# Patient Record
Sex: Female | Born: 1970 | Hispanic: Yes | Marital: Married | Smoking: Never smoker
Health system: Southern US, Community
[De-identification: ages and names within clinical notes are randomized; demographics above are authoritative.]

## PROBLEM LIST (undated history)

## (undated) ENCOUNTER — Ambulatory Visit: Admission: EM

## (undated) DIAGNOSIS — N950 Postmenopausal bleeding: Secondary | ICD-10-CM

## (undated) DIAGNOSIS — E7800 Pure hypercholesterolemia, unspecified: Secondary | ICD-10-CM

## (undated) DIAGNOSIS — F329 Major depressive disorder, single episode, unspecified: Secondary | ICD-10-CM

## (undated) DIAGNOSIS — N870 Mild cervical dysplasia: Secondary | ICD-10-CM

## (undated) DIAGNOSIS — R7303 Prediabetes: Secondary | ICD-10-CM

## (undated) DIAGNOSIS — F32A Depression, unspecified: Secondary | ICD-10-CM

## (undated) DIAGNOSIS — E669 Obesity, unspecified: Secondary | ICD-10-CM

## (undated) HISTORY — PX: TUBAL LIGATION: SHX77

## (undated) HISTORY — DX: Obesity, unspecified: E66.9

## (undated) HISTORY — DX: Prediabetes: R73.03

## (undated) HISTORY — DX: Postmenopausal bleeding: N95.0

## (undated) HISTORY — DX: Pure hypercholesterolemia, unspecified: E78.00

## (undated) HISTORY — DX: Mild cervical dysplasia: N87.0

---

## 2008-11-28 ENCOUNTER — Emergency Department (HOSPITAL_COMMUNITY): Admission: EM | Admit: 2008-11-28 | Discharge: 2008-11-28 | Payer: Self-pay | Admitting: Emergency Medicine

## 2009-03-03 ENCOUNTER — Emergency Department (HOSPITAL_COMMUNITY): Admission: EM | Admit: 2009-03-03 | Discharge: 2009-03-04 | Payer: Self-pay | Admitting: Emergency Medicine

## 2009-04-05 ENCOUNTER — Ambulatory Visit: Payer: Self-pay | Admitting: Obstetrics & Gynecology

## 2009-06-24 ENCOUNTER — Emergency Department (HOSPITAL_COMMUNITY): Admission: EM | Admit: 2009-06-24 | Discharge: 2009-06-24 | Payer: Self-pay | Admitting: Emergency Medicine

## 2009-07-11 ENCOUNTER — Ambulatory Visit: Payer: Self-pay | Admitting: Obstetrics and Gynecology

## 2009-08-23 ENCOUNTER — Ambulatory Visit: Payer: Self-pay | Admitting: Obstetrics and Gynecology

## 2009-08-24 ENCOUNTER — Encounter (INDEPENDENT_AMBULATORY_CARE_PROVIDER_SITE_OTHER): Payer: Self-pay | Admitting: *Deleted

## 2009-08-24 LAB — CONVERTED CEMR LAB
GC Probe Amp, Genital: NEGATIVE
Yeast Wet Prep HPF POC: NONE SEEN

## 2010-04-13 LAB — POCT URINALYSIS DIP (DEVICE)
Bilirubin Urine: NEGATIVE
Nitrite: NEGATIVE
Specific Gravity, Urine: >=1.03 (ref 1.005–1.030)
Urobilinogen, UA: 1 mg/dL (ref 0.0–1.0)
pH: 5 (ref 5.0–8.0)

## 2010-04-15 LAB — RAPID STREP SCREEN (MED CTR MEBANE ONLY): Streptococcus, Group A Screen (Direct): POSITIVE — AB

## 2010-04-18 LAB — CBC
HCT: 36.9 % (ref 36.0–46.0)
MCV: 92.5 fL (ref 78.0–100.0)
RDW: 12.9 % (ref 11.5–15.5)
WBC: 7.6 10*3/uL (ref 4.0–10.5)

## 2010-04-18 LAB — DIFFERENTIAL
Basophils Absolute: 0 10*3/uL (ref 0.0–0.1)
Basophils Relative: 0 % (ref 0–1)
Eosinophils Absolute: 0.2 10*3/uL (ref 0.0–0.7)
Monocytes Relative: 6 % (ref 3–12)

## 2010-04-18 LAB — COMPREHENSIVE METABOLIC PANEL
ALT: 18 U/L (ref 0–35)
Alkaline Phosphatase: 63 U/L (ref 39–117)
Calcium: 8.9 mg/dL (ref 8.4–10.5)
Chloride: 105 mEq/L (ref 96–112)
Creatinine, Ser: 0.43 mg/dL (ref 0.4–1.2)
GFR calc Af Amer: >60 mL/min (ref >60)
Potassium: 3.8 mEq/L (ref 3.5–5.1)
Sodium: 138 mEq/L (ref 135–145)

## 2010-04-18 LAB — URINALYSIS, ROUTINE W REFLEX MICROSCOPIC
Glucose, UA: NEGATIVE mg/dL
Ketones, ur: NEGATIVE mg/dL
Nitrite: NEGATIVE
pH: 6 (ref 5.0–8.0)

## 2010-04-18 LAB — URINE CULTURE
Colony Count: NO GROWTH
Culture: NO GROWTH

## 2010-04-18 LAB — APTT: aPTT: 29 seconds (ref 24–37)

## 2010-04-18 LAB — WET PREP, GENITAL
Clue Cells Wet Prep HPF POC: NONE SEEN
Trich, Wet Prep: NONE SEEN
Yeast Wet Prep HPF POC: NONE SEEN

## 2010-04-18 LAB — GC/CHLAMYDIA PROBE AMP, GENITAL
Chlamydia, DNA Probe: NEGATIVE
GC Probe Amp, Genital: NEGATIVE

## 2010-04-18 LAB — PROTIME-INR
INR: 1.1 (ref 0.00–1.49)
Prothrombin Time: 14.1 seconds (ref 11.6–15.2)

## 2010-04-18 LAB — ABO/RH: ABO/RH(D): O POS

## 2010-04-18 LAB — POCT PREGNANCY, URINE: Preg Test, Ur: NEGATIVE

## 2010-04-22 LAB — POCT PREGNANCY, URINE: Preg Test, Ur: NEGATIVE

## 2010-05-01 LAB — GC/CHLAMYDIA PROBE AMP, GENITAL: Chlamydia, DNA Probe: NEGATIVE

## 2010-06-05 ENCOUNTER — Inpatient Hospital Stay (HOSPITAL_COMMUNITY)
Admission: AD | Admit: 2010-06-05 | Discharge: 2010-06-05 | Disposition: A | Discharge status: Home / Self Care | Payer: Self-pay | Source: Ambulatory Visit | Attending: Obstetrics and Gynecology | Admitting: Obstetrics and Gynecology

## 2010-06-05 DIAGNOSIS — K5900 Constipation, unspecified: Secondary | ICD-10-CM

## 2010-06-05 DIAGNOSIS — M549 Dorsalgia, unspecified: Secondary | ICD-10-CM

## 2010-06-05 LAB — URINALYSIS, ROUTINE W REFLEX MICROSCOPIC
Bilirubin Urine: NEGATIVE
Glucose, UA: NEGATIVE mg/dL
Ketones, ur: NEGATIVE mg/dL
Nitrite: NEGATIVE
pH: 6.5 (ref 5.0–8.0)

## 2010-06-05 LAB — WET PREP, GENITAL
Clue Cells Wet Prep HPF POC: NONE SEEN
Trich, Wet Prep: NONE SEEN
Yeast Wet Prep HPF POC: NONE SEEN

## 2010-06-05 LAB — URINE MICROSCOPIC-ADD ON

## 2010-09-26 ENCOUNTER — Encounter: Payer: Self-pay | Admitting: *Deleted

## 2010-09-26 ENCOUNTER — Ambulatory Visit (INDEPENDENT_AMBULATORY_CARE_PROVIDER_SITE_OTHER): Payer: Self-pay | Admitting: Obstetrics and Gynecology

## 2010-09-26 DIAGNOSIS — N949 Unspecified condition associated with female genital organs and menstrual cycle: Secondary | ICD-10-CM

## 2010-09-26 DIAGNOSIS — Z124 Encounter for screening for malignant neoplasm of cervix: Secondary | ICD-10-CM

## 2010-09-26 DIAGNOSIS — R102 Pelvic and perineal pain: Secondary | ICD-10-CM

## 2010-09-26 NOTE — Progress Notes (Signed)
Used Language Line [phone]

## 2010-09-26 NOTE — Progress Notes (Signed)
This patient is a 40 year old Spanish-speaking Hispanic female gravida 4 para 76 whose youngest child is 50 years old and at which time she had a postpartum tubal ligation. For the past year she has developed severe low abdominal pain which starts midway between the umbilicus and the symphysis pubis in spreads laterally into her kidney area and down into her thighs. This pain starts 2 weeks before her period and she gets complete relief at the onset of her period. She never had sex or and this time because she is afraid of increasing the pain she takes ibuprofen for the pain occasionally but doesn't like to continue on medication. The pain is so bad when she gets up but she doesn't want to be around her children.  Examination: Breasts: Symmetrical no dominant masses no nipple discharge, no supraclavicular nor axillary nodes palpable. Abdomen: Soft flat nontender no masses no organomegaly. Genitalia: External genitalia within normal limits BUS within normal limits vagina: Clean and well rugated with no significant discharge. Cervix clean and parous well epithelialized. Bimanual pelvic examination: Uterus anterior normal size shape consistency adnexa normal. Pap smear was taken.  Plan is to get a pelvic ultrasound have the patient return in 2 weeks for results  Assessment: My feeling is that this is not simply mittelschmerz. Which can start out very abruptly but usually wanes over the next few days. 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  Impression: Normal pelvic examination with postovulatory pelvic pain lasting 2 weeks end of dictation

## 2010-10-02 ENCOUNTER — Ambulatory Visit (HOSPITAL_COMMUNITY)
Admission: RE | Admit: 2010-10-02 | Discharge: 2010-10-02 | Disposition: A | Discharge status: Home / Self Care | Payer: Self-pay | Source: Ambulatory Visit | Attending: Obstetrics and Gynecology | Admitting: Obstetrics and Gynecology

## 2010-10-02 DIAGNOSIS — N949 Unspecified condition associated with female genital organs and menstrual cycle: Secondary | ICD-10-CM | POA: Insufficient documentation

## 2010-10-02 DIAGNOSIS — R102 Pelvic and perineal pain: Secondary | ICD-10-CM

## 2010-10-02 DIAGNOSIS — D251 Intramural leiomyoma of uterus: Secondary | ICD-10-CM | POA: Insufficient documentation

## 2011-01-07 ENCOUNTER — Encounter (HOSPITAL_COMMUNITY): Payer: Self-pay | Admitting: *Deleted

## 2011-01-07 ENCOUNTER — Inpatient Hospital Stay (HOSPITAL_COMMUNITY)
Admission: AD | Admit: 2011-01-07 | Discharge: 2011-01-07 | Disposition: A | Discharge status: Home / Self Care | Payer: Self-pay | Source: Ambulatory Visit | Attending: Obstetrics & Gynecology | Admitting: Obstetrics & Gynecology

## 2011-01-07 DIAGNOSIS — N63 Unspecified lump in unspecified breast: Secondary | ICD-10-CM | POA: Insufficient documentation

## 2011-01-07 DIAGNOSIS — N6313 Unspecified lump in the right breast, lower outer quadrant: Secondary | ICD-10-CM

## 2011-01-07 NOTE — Progress Notes (Signed)
[facility] Department of Clinical Social Work Documentation of Interpretation   I assisted ___Jolynn RN________________ with interpretation of __questions____________________ for this patient. 

## 2011-01-07 NOTE — ED Provider Notes (Signed)
History   Pt presents today c/o 2 "lumps" in her right breast that she recently noticed. She denies nipple dc, recent trauma, fever, or any other sx at this time.  Chief Complaint  Patient presents with  . Breast Mass   HPI  OB History    Grav Para Term Preterm Abortions TAB SAB Ect Mult Living   4 4 4      1 5       Past Medical History  Diagnosis Date  . No pertinent past medical history     Past Surgical History  Procedure Date  . Tubal ligation     7 years ago    Family History  Problem Relation Age of Onset  . Hypertension Mother   . Diabetes Mother   . Heart disease Father   . Anesthesia problems Neg Hx     History  Substance Use Topics  . Smoking status: Never Smoker   . Smokeless tobacco: Never Used  . Alcohol Use: No    Allergies: No Known Allergies  No prescriptions prior to admission    Review of Systems  Constitutional: Negative for fever.  Respiratory: Negative for cough.   Cardiovascular: Negative for chest pain and palpitations.  Gastrointestinal: Negative for nausea, vomiting, abdominal pain, diarrhea and constipation.  Genitourinary: Negative for dysuria, urgency, frequency and hematuria.  Neurological: Negative for dizziness and headaches.  Psychiatric/Behavioral: Negative for depression and suicidal ideas.   Physical Exam   Blood pressure 115/72, pulse 74, temperature 98.1 F (36.7 C), temperature source Oral, resp. rate 20, height 5\' 3"  (1.6 m), weight 169 lb (76.658 kg), last menstrual period 12/27/2010.  Physical Exam  Nursing note and vitals reviewed. Constitutional: She is oriented to person, place, and time. She appears well-developed and well-nourished. No distress.  HENT:  Head: Normocephalic and atraumatic.  Respiratory:       Pt with 2 1cm nodules at 6 and 8 o'clock on the right breast. Both nodules are firm and mobile. No nipple dc. No erythema, edema, or drainage noted.  GI: Soft. She exhibits no distension. There is no  tenderness. There is no rebound and no guarding.  Neurological: She is alert and oriented to person, place, and time.  Skin: Skin is warm and dry. She is not diaphoretic.  Psychiatric: She has a normal mood and affect. Her behavior is normal. Judgment and thought content normal.    MAU Course  Procedures    Assessment and Plan  Breast Lump: discussed with pt at length. Appt made [facility] pt at [facility] on 01/22/11 at 1:00pm. Discussed with pt at length.   Clinton Gallant. Kaya Pottenger III, DrHSc, MPAS, PA-C  01/07/2011, 1:43 PM   Henrietta Hoover, PA 01/07/11 1356

## 2011-01-07 NOTE — Progress Notes (Signed)
2 lumps in rt breast (about 4 o'clock) noted 2 days ago. Little pain., slight tender to touch.

## 2011-01-22 ENCOUNTER — Ambulatory Visit
Admission: RE | Admit: 2011-01-22 | Discharge: 2011-01-22 | Disposition: A | Discharge status: Home / Self Care | Payer: Self-pay | Source: Ambulatory Visit | Attending: Obstetrics & Gynecology | Admitting: Obstetrics & Gynecology

## 2011-01-22 ENCOUNTER — Ambulatory Visit: Admit: 2011-01-22 | Payer: Self-pay

## 2011-01-22 ENCOUNTER — Other Ambulatory Visit (HOSPITAL_COMMUNITY): Payer: Self-pay | Admitting: Obstetrics & Gynecology

## 2011-01-22 ENCOUNTER — Ambulatory Visit
Admit: 2011-01-22 | Discharge: 2011-01-22 | Disposition: A | Discharge status: Home / Self Care | Payer: Self-pay | Attending: Obstetrics & Gynecology | Admitting: Obstetrics & Gynecology

## 2011-01-22 DIAGNOSIS — N6313 Unspecified lump in the right breast, lower outer quadrant: Secondary | ICD-10-CM

## 2011-09-15 ENCOUNTER — Emergency Department (HOSPITAL_COMMUNITY)
Admission: EM | Admit: 2011-09-15 | Discharge: 2011-09-15 | Disposition: A | Discharge status: Home / Self Care | Payer: Self-pay | Attending: Emergency Medicine | Admitting: Emergency Medicine

## 2011-09-15 ENCOUNTER — Encounter (HOSPITAL_COMMUNITY): Payer: Self-pay | Admitting: Emergency Medicine

## 2011-09-15 DIAGNOSIS — Z833 Family history of diabetes mellitus: Secondary | ICD-10-CM | POA: Insufficient documentation

## 2011-09-15 DIAGNOSIS — Z8489 Family history of other specified conditions: Secondary | ICD-10-CM | POA: Insufficient documentation

## 2011-09-15 DIAGNOSIS — M62838 Other muscle spasm: Secondary | ICD-10-CM | POA: Insufficient documentation

## 2011-09-15 DIAGNOSIS — Z8249 Family history of ischemic heart disease and other diseases of the circulatory system: Secondary | ICD-10-CM | POA: Insufficient documentation

## 2011-09-15 MED ORDER — IBUPROFEN 800 MG PO TABS: 800.0000 mg | ORAL_TABLET | Freq: Three times a day (TID) | ORAL | Status: AC | PRN

## 2011-09-15 NOTE — ED Notes (Signed)
Pt c/o pain and "knot on left breast" x 2 weeks; pt sts pain into back and arm

## 2011-09-15 NOTE — ED Provider Notes (Signed)
History   This chart was scribed for No att. providers found by Melba Coon. The patient was seen in room TR08C/TR08C and the patient's care was started at 1815.    CSN: [id number]  Arrival date & time 09/15/11  1725   None     Chief Complaint  Patient presents with  . Breast Pain    (Consider location/radiation/quality/duration/timing/severity/associated sxs/prior treatment) The history is provided by a relative. A language interpreter was used.  Pt asked to use family member, declined telephone interpreter.  Crystal [name] is a 41 y.o. female who presents to the Emergency Department complaining of constant, moderate left breast pain pertaining to a present mass/knot with an onset 2 weeks ago. States she has pain in L shoulder and arm, worse with movement. No nipple drainage, no fever.     No HA, fever, neck pain, sore throat, rash, SOB, abd pain, n/v/d, dysuria, or extremity edema, weakness, numbness, or tingling. No known allergies. No other pertinent medical symptoms.   Past Medical History  Diagnosis Date  . No pertinent past medical history     Past Surgical History  Procedure Date  . Tubal ligation     7 years ago    Family History  Problem Relation Age of Onset  . Hypertension Mother   . Diabetes Mother   . Heart disease Father   . Anesthesia problems Neg Hx     History  Substance Use Topics  . Smoking status: Never Smoker   . Smokeless tobacco: Never Used  . Alcohol Use: No    OB History    Grav Para Term Preterm Abortions TAB SAB Ect Mult Living   4 4 4      1 5       Review of Systems 10 Systems reviewed and all are negative for acute change except as noted in the HPI.   Allergies  Review of patient's allergies indicates no known allergies.  Home Medications   Current Outpatient Rx  Name Route Sig Dispense Refill  . IBUPROFEN 200 MG PO TABS Oral Take 200 mg by mouth every 6 (six) hours as needed. For cramping.       BP  126/74  Pulse 103  Temp 98.1 F (36.7 C) (Oral)  Resp 16  SpO2 99%  Physical Exam  Constitutional: She is oriented to person, place, and time. She appears well-developed and well-nourished.  HENT:  Head: Normocephalic and atraumatic.  Neck: Neck supple.  Pulmonary/Chest: Effort normal. Left breast exhibits no inverted nipple, no mass, no nipple discharge, no skin change and no tenderness. Breasts are symmetrical.       No axillary lymphadenopathy  Musculoskeletal:       Tender in L trapezius muscle  Neurological: She is alert and oriented to person, place, and time. No cranial nerve deficit.  Psychiatric: She has a normal mood and affect. Her behavior is normal.    ED Course  Procedures (including critical care time)  DIAGNOSTIC STUDIES: Oxygen Saturation is 99% on room air, normal by my interpretation.    COORDINATION OF CARE:     Labs Reviewed - No data to display No results found.   No diagnosis found.    MDM  Pt has paperwork from visit to [facility] in 12/2010 which advised 6 month followup. She has not been back for evaluation, does not have PCP. She does not have any discernible breast mass on my exam. Shoulder tenderness is muscle spasm. Advised NSAID, establish with PCP and followup  in [facility].   I personally performed the services described in the documentation, which were scribed in my presence. The recorded information has been reviewed and considered.         Tesla Bochicchio B. Bernette Mayers, MD 09/15/11 1610

## 2011-10-16 ENCOUNTER — Ambulatory Visit (INDEPENDENT_AMBULATORY_CARE_PROVIDER_SITE_OTHER): Payer: Self-pay | Admitting: Obstetrics & Gynecology

## 2011-10-16 ENCOUNTER — Encounter: Payer: Self-pay | Admitting: Obstetrics & Gynecology

## 2011-10-16 VITALS — BP 112/81 | HR 63 | Temp 97.9°F | Ht 62.0 in | Wt 169.2 lb

## 2011-10-16 DIAGNOSIS — Z01419 Encounter for gynecological examination (general) (routine) without abnormal findings: Secondary | ICD-10-CM

## 2011-10-16 NOTE — Patient Instructions (Signed)
Cuidados preventivos en las mujeres adultas  (Preventive Care for Adults, Female) Un estilo de vida saludable y los cuidados preventivos pueden favorecer la salud y el bienestar. Las guas para conservar la salud para las mujeres incluyen las siguientes prcticas clave:   Un examen fsico de rutina anual es un buen modo de controlar su salud y realizar estudios preventivos. Le da la posibilidad de compartir preocupaciones y conocer el estado de su salud, y que le realicen estudios completos.   Consulte al dentista para realizar un examen de rutina y cuidados preventivos cada 6 meses. Cepllese los dientes al menos dos veces por da y psese el hilo dental al menos una vez por da. Una buena higiene bucal evita caries y enfermedades de las encas.   La frecuencia con que debe hacerse exmenes de la vista depende de la edad, el estado de salud, la historia familiar, el uso de lentes de contacto y otros factores. Siga las recomendaciones del mdico para saber con qu frecuencia debe hacerse exmenes de la vista.   Consuma una dieta saludable. Los alimentos como vegetales, frutas, granos enteros, productos lcteos descremados y protenas magras contienen los nutrientes que usted necesita sin necesidad de consumir muchas caloras. Disminuya el consume de alimentos con alto contenido de grasas slidas, azcar y sal agregadas. Consuma la cantidad de caloras adecuada para usted.Si es necesario, pdale una dieta adecuada al profesional que lo asiste.   La actividad fsica regular es una de las cosas ms importantes que puede hacer por su salud. Los adultos deben hacer al menos 150 minutos de ejercicios de actividad de intensidad moderada (toda actividad que aumente la frecuencia cardaca y lo haga transpirar) cada semana. Adems, la mayora de los adultos necesita ejercicios de fortalecimiento muscular 2  ms das por semana.   Hay que mantener un peso saludable. El ndice de masa corporal (IMC) es una  herramienta que identifica posibles problemas con el peso. Proporciona una estimacin de la grasa corporal basndose en el peso y la altura. El mdico podr determinar si IMC y podr ayudarlo a lograr o mantener un peso saludable.Para los adultos de 20 aos o ms:   Un IMC menor a 18,5 se considera bajo peso.   Un IMC entre 18,5 y 24,9 es normal.   Un IMC entre 25 y 29,9 es sobrepeso.   Un IMC entre 30 o ms es obesidad.   Mantenga un nivel normal de lpidos y colesterol en sangre practicando actividad fsica y minimizando la ingesta de grasas saturadas. Consuma una dieta balanceada y saludable, e incluya variedad de frutas y vegetales. Los anlisis de lpidos y colesterol en sangre deben comenzar a los 20 aos y repetirse cada 5 aos. Si los niveles de colesterol son altos, tiene ms de 50 aos o tiene riesgo elevado de sufrir enfermedades cardacas necesitar controlarse con ms frecuencia.Si tiene niveles elevados de lpidos y colesterol, debe recibir tratamiento con medicamentos, si la dieta y el ejercicio no son efectivos.   Si fuma, consulte con el profesional acerca de las opciones para dejar de hacerlo. Si no lo hace, no comience.   Si est embarazada no beba alcohol. Si est amamantando, beba alcohol con prudencia. Si elige beber alcohol, no se exceda de 1 medida por da. Se considera una medida a 12 onzas (355 ml) de cerveza, 5 onzas (148 ml) de vino, o 1,5 onzas (44 ml) de licor.   Evite el alcohol y el consumo de drogas. No comparta agujas. Pida ayuda si necesita   asistencia o instrucciones con respecto a abandonar el consumo de alcohol, cigarrillos o drogas.   La hipertensin arterial causa enfermedades cardacas y aumenta el riesgo de ictus. Debe controlar su presin arterial al menos cada 1 o 2 aos. La presin arterial elevada que persiste debe tratarse con medicamentos si la prdida de peso y el ejercicio no son efectivos.   Si tiene entre 55 y 79 aos, consulte a su mdico si  debe tomar aspirina para prevenir enfermedades cardacas.   Los anlisis para la diabetes incluyen la toma de una muestra de sangre para controlar el nivel de azcar en la sangre durante el ayuno. Debe hacerlo cada 3 aos despus de los 45 aos si est dentro de su peso normal y sin factores de riesgo para la diabetes. Las pruebas deben comenzar a edades tempranas o llevarse a cabo con ms frecuencia si tiene sobrepeso y al menos 1 factor de riesgo para la diabetes.   Las evaluaciones para detectar el cncer de mama son un mtodo preventivo fundamental para las mujeres. Debe practicar la "autoconciencia de las mamas". Esto significa que debe comprender como es la apariencia normal y como se sienten sus mamas e incluir un autoexamen. Si detecta algn cambio, no importa cun pequeo sea, debe informarlo a su mdico. Las mujeres entre 20 y 30 aos deben hacer un examen clnico de las mamas como parte del examen regular de salud, cada 1 a 3 aos. Despus de los 40 aos deben hacerlo todos los aos. Deben hacerse una mamografa cada ao, comenzando a los 40 aos. Las mujeres con historia familiar de cncer de mama deben hablar con el mdico para hacer un estudio gentico. Las que tienen ms riesgo deben hacerse una ecografa y una mamografa todos los aos.   Un papanicolau se realiza para diagnosticar cncer de cuello de tero. Muestra los cambios celulares en el cuello que pueden transformarse en cncer si no se tratan. El papanicolau es un procedimiento por el que se obtienen clulas de la parte inferior del tero (cuello) y son examinadas.   Las mujeres deben hacerse un papanicolau a partir de los 21 aos.   Entre los 21 y los 29 aos debe repetirse cada dos aos.   Luego de los 30 aos, debe realizarse un Papanicolaou cada tres aos siempre que los 3 estudios anteriores sean normales.   Algunas mujeres sufren problemas mdicos que aumenta la probabilidad de contraer cncer cervical. Consulte a su  mdico acerca de estos problemas. Es muy importante que le informe a su mdico si aparecen nuevos problemas poco despus de su ltimo Papanicolaou. En estos casos, el mdico podr indicar que se realice el Papanicolaou con ms frecuencia.   Estas recomendaciones son las mismas para todas las mujeres hayan recibido o no la vacuna para el VPH (virus del papiloma humano).   Si le han realizado una histerectoma por un problema que no era cncer u otra enfermedad que podra causar cncer, ya no necesitar un Papanicolaou. Sin embargo, si ya no necesita hacerse un Papanicolau, es una buena idea hacerse un examen regularmente para asegurarse de que no hay otros problemas.   Si tiene entre 65 y 70 aos y ha tenido un Papanicolaou normal en los ltimos 10 aos, ya no ser necesario realizarlo. Sin embargo, si ya no necesita hacerse un Papanicolau, es una buena idea hacerse un examen regularmente para asegurarse de que no hay otros problemas.   Si ha recibido un tratamiento para el cncer cervical o para   una enfermedad que podra causar cncer, necesitar realizar un Papanicolaou y controles durante al menos 20 aos de concluir el tratamiento.   Si no se ha hecho el examen con regularidad, debern volver a evaluarse los factores de riesgo (como el tener un nuevo compaero sexual) para determinar si debe volver a realizarse los estudios.   La prueba del VPH es un anlisis adicional que puede usarse para detectar cncer de cuello de tero. Esta prueba busca la presencia del virus que causa los cambios en el cuello. Las clulas que se recolectan durante el papanicolau pueden usarse para el VPH. La prueba para el VPH puede usarse para evaluar a mujeres de ms de 30 aos y debe usarse en mujeres de cualquier edad cuyos resultados del papanicolau no sean claros. Despus de los 30 aos, las mujeres deben hacerse el anlisis para el VPH con la misma frecuencia que el papanicolau.   El cncer colorectal puede detectarse  y con fecuencia puede prevenirse. La mayor parte de los estudios de rutina comienzan a los 50 aos y continan hasta los 75 aos. Sin embargo, el mdico podr aconsejarle que lo haga antes, si tiene factores de riesgo para el cncer de colon. Una vez por ao, el profesional le dar un kit de prueba para hallar sangre oculta en la materia fecal. Utiliza un tubo con una pequea cmara en su extremo para examinar directamente el colon (sigmoidoscopa o colonoscopa), para detectar formas temprana de cncer colorectal. Hable con su mdico si tiene 50 aos, cuando comience con los estudios de rutina. El examen directo del colon debe repetirse cada 5 a 10 aos, hasta los 75 aos, excepto que se encuentren formas tempranas de plipos precancerosos o pequeos bultos.   Se recomienda realizar un anlisis de sangre para detectar hepatitis C a todas las personas nacidas entre 1945 y 1965, y a todo aquel que tenga un riesgo conocido de haber contrado esta enfermedad.   Practique el sexo seguro. Use condones y evite las prcticas sexuales riesgosas para disminuir el contagio de enfermedades de transmisin sexual. Las enfermedades de transmisin sexual son la gonorrea, clamidia, sfilis, tricomonas, herpes, VPH y el virus de inmunodeficiencia humana (VIH). El herpes, el VIH y el VPH son enfermedades virales que no tienen cura. Pueden producir discapacidad, cncer y hasta la muerte. Las mujeres sexualmente activas de 25 aos o menos deben ser evaluadas para detectar clamidia. Las mujeres mayores que tengan mltiples compaeros tambin deben hacerse el anlisis para detectar clamidia. Se recomienda realizar anlisis para detectar otras enfermedades de transmisin sexual si es sexualmente activa y tiene riesgos.   La osteoporosis es una enfermedad en la que los huesos pierden los minerales y la fuerza por el avance de la edad. El resultado pueden ser fracturas graves en los huesos. El riesgo de osteoporosis puede  identificarse con una prueba de densidad sea. Las mujeres de ms de 65 aos y las que tengan riesgos de sufrir fracturas u osteoporosis deben pedir consejo a su mdico. Consulte a su mdico si debe tomar un suplemento de calcio o de vitamina D para reducir el riesgo de osteoporosis.   La menopausia se asocia a sntomas y riesgos fsicos. Se dispone de una terapia de reemplazo hormonal para disminuir los sntomas y los riesgos. Consulte a su mdico para saber si la terapia de reemplazo hormonal es conveniente para usted.   Use una pantalla solar con un factor SPF de 30 o mayor. Aplique pantalla de manera libre y repetida a lo largo   del da. Pngase al resguardo del sol cuando la sombra sea ms pequea que usted. Protjase usando mangas y pantalones largos, un sombrero de ala ancha y gafas para el sol todo el ao, siempre que se encuentre en el exterior.   Una vez por mes hgase un examen de la piel de todo el cuerpo usando un espejo para ver la espalda. nforme al mdico si aparecen nuevos lunares, los que ya estn tienen bordes irregulares, los que sean ms grandes que una goma de lpiz o los que hayan cambiado su forma o color.   Mantngase al da con las vacunas.   Gripe: Debe aplicarse una dosis todos en cada otoo (o invierno). La composicin de la vacuna de la gripe cambia todos los aos, por lo tanto no es suficiente con vacunarse una vez.   Vacuna antineumocccica de polisacridos Debe aplicarse 1  2 dosis si fuma o si sufre ciertas enfermedades crnicas. Necesitar 1 dosis a los 65 aos (o ms) si nunca se ha vacunado.   Vacuna difteria, ttanos, tos convulsa (DTP). Aplquese una dosis de la vacuna DTaP (vacuna contra la tos convulsa para adultos) si es menor de 65 aos, si tiene ms de 65 aos y est en contacto con un beb, es un trabajador de la salud, es una mujer embarazada o simplemente quiere estar protegido de la enfermedad. Luego necesitar un refuerzo de DT cada 10 aos. Consulte  con su mdico si no ha recibido al menos 3 dosis de la vacuna contra el ttanos (y la difteria) en algn momento de su vida o tiene una herida profunda o sucia.   VPH: Debe aplicarse esta vacuna si tiene 26 aos o menos. La vacuna se administra en 3 dosis, generalmente durante el curso de 6 meses.   MMR (sarampin, paperas, rubola) Debe aplicarse al menos una dosis de MMR si ha nacido en 1957 o despus. Podra tambin necesitar una segunda dosis.   Antimeningocccica Si tiene entre 19 y 21 aos y es un estudiante universitario de primer ao que vive en una residencia estudiantil, o tiene alguna enfermedad mdica, debe recibir esta vacuna. Podra tambin necesitar dosis de refuerzo.   Herpes zoster (culebrilla). Si tiene 60 aos o ms debe aplicarse esta vacuna ahora.   Varicela Si nunca se vacun o slo recibi una dosis, hable con su mdico para averiguar si necesita aplicarse esta vacuna.   Hepatitis A. Debe aplicarse esta vacuna si tiene un factor de riesgo especfico para contraer una infeccin por el virus de la hepatitis A, o simplemente desea estar protegido contra la enfermedad. La vacuna se administra en 2 dosis, con una diferencia entre 6 y 18 meses.   Hepatitis B. Debe aplicarse esta vacuna si tiene un factor de riesgo especfico para contraer una infeccin por el virus de la hepatitis B, o simplemente desea estar protegido contra la enfermedad. La vacuna se administra en 3 dosis, generalmente durante el curso de 6 meses.  Controles preventivos - Frecuencia Edad 19 a 39  Control de la presin arterial.** / Cada 1 a 2 aos.   Control de lpidos y colesterol.** / Cada 5 aos, comenzando a los 20 aos.   Examen clnico de mamas.** / Cada 3 aos en las mujeres entre los 20 y los 30 aos.   Papanicolau.** / Cada 2 aos entre los 21 y los 29 aos. Despus de los 30 aos, y hasta los 65 o 70, con una historia de 3 papanicolau normales consecutivos.   Pruebas para   el VPH.** / Cada 3  aos, a partir de los 30 aos, y hasta los 65 o 70, con una historia de 3 papanicolau normales consecutivos.   Anlisis de sangre para la hepatitis C. ** / Para todo individuo con riesgos conocidos para la hepatitis C.   Autoexamen de piel. / Todos los meses.   Vacuna contra la gripe.** / Todos los aos.   Vacuna antineumocccica de polisacridos.** / Debe aplicarse 1  2 dosis si fuma o si sufre ciertas enfermedades crnicas.   Vacuna difteria, ttanos, tos convulsa (Tdap, Td). / Una dosis nica de vacuna Tdap. Luego necesitar un refuerzo de DT cada 10 aos.   Vacuna contra el VPH. / 3 dosis en el curso de 6 meses, si tiene 26 aos o menos.   MMR (sarampin, paperas, rubola). / Debe aplicarse al menos una dosis de MMR si ha nacido en 1957 o despus. Podra tambin necesitar una segunda dosis.   Vacunacin antimeningocccica. / Si tiene entre 19 y 21 aos y es un estudiante universitario de primer ao que vive en una residencia estudiantil, o tiene alguna enfermedad mdica, debe recibir esta vacuna. Podra tambin necesitar dosis de refuerzo.   Vacuna contra la varicela.** / Consltelo con el mdico.   Vacuna contra la hepatitis A.** / Consltelo con el mdico. 2 dosis, con un intervalo entre 6 a 18 meses.   Vacuna contra la hepatitis B.** / Consltelo con el mdico. 3 dosis en el curso de 6 meses.  Edad 40 a 64  Control de la presin arterial.** / Cada 1 a 2 aos.   Control de lpidos y colesterol. **/ Cada 5 aos, comenzando a los 20 aos.   Examen clnico de mamas.** / Todos los aos despus de los 40 aos.   Mamografa.** / Una vez por ao a partir de los 40 aos y continuando siempre que tenga buena salud. Consulte con el mdico.   Papanicolau.** / Cada 3 aos despus de los 30 aos, y hasta los 65 o 70, con una historia de 3 papanicolau normales consecutivos.   Pruebas para el VPH.** / Cada 3 aos despus de los 30 aos, y hasta los 65 o 70, con una historia de 3  papanicolau normales consecutivos.   Prueba de sangre oculta en materia fecal. / Cada ao comenzando a los 50 aos continuando hasta los 75. No tendr que hacerlo si se ha hecho una colonoscopa cada 10 aos.   Sigmoidoscopa flexible** o colonoscopa.** / Cada 5 aos para la sigmoidoscopa flexible o cada 10 aos para la colonoscopa, comenzando a los 50 aos y continuando hasta los 75 aos.   Anlisis de sangre para la hepatitis C. ** / Para todas las personas nacidas entre 1945 y 1965 y a todo aquel que tenga un riesgo conocido para la hepatitis C.   Autoexamen de piel. / Todos los meses.   Vacuna contra la gripe.** / Todos los aos.   Vacuna antineumocccica de polisacridos.** / Debe aplicarse 1  2 dosis si fuma o si sufre ciertas enfermedades crnicas.   Vacuna difteria, ttanos, tos convulsa (Tdap, Td). / Una dosis nica de vacuna Tdap. Luego necesitar un refuerzo de DT cada 10 aos.   MMR (sarampin, paperas, rubola). / Debe aplicarse al menos una dosis de MMR si ha nacido en 1957 o despus. Podra tambin necesitar una segunda dosis.   Vacuna contra la varicela.**/ Consltelo con el mdico.   Vacunacin antimeningocccica. / Consltelo con el mdico.   Vacuna contra   la hepatitis A.**/ Consltelo con el mdico. 2 dosis, con un intervalo entre 6 a 18 meses.   Vacuna contra la hepatitis B.** / Consltelo con el mdico. 3 dosis en el curso de 6 meses.  Edad 65 o ms  Control de la presin arterial.** / Cada 1 a 2 aos.   Control de lpidos y colesterol. **/ Cada 5 aos, comenzando a los 20 aos.   Examen clnico de mamas.** / Todos los aos despus de los 40 aos.   Mamografa.** / Una vez por ao a partir de los 40 aos y continuando siempre que tenga buena salud. Consulte con el mdico.   Papanicolau. ** / Cada 3 aos despus de los 30 aos, y hasta los 65 o 70, con una historia de 3 papanicolau normales consecutivos. Las pruebas pueden detenerse entre los 65 y los 70  aos, si tiene 3 papanicolau consecutivos normales y no tuvo un papanicoalu ni prueba de VPH anormales en los ltimos 10 aos.   Pruebas para el VPH.** / Cada 3 aos despus de los 30 aos, y hasta los 65 o 70, con una historia de 3 papanicolau normales consecutivos. Las pruebas pueden detenerse entre los 65 y los 70 aos, si tiene 3 papanicolau consecutivos normales y no tuvo un papanicoalu ni prueba de VPH anormales en los ltimos 10 aos.   Prueba de sangre oculta en materia fecal. / Cada ao comenzando a los 50 aos continuando hasta los 75. No tendr que hacerlo si se ha hecho una colonoscopa cada 10 aos.   Sigmoidoscopa flexible** o colonoscopa.** / Cada 5 aos para la sigmoidoscopa flexible o cada 10 aos para la colonoscopa, comenzando a los 50 aos y continuando hasta los 75 aos.   Anlisis de sangre para la hepatitis C. ** / Para todas las personas nacidas entre 1945 y 1965 y a todo aquel que tenga un riesgo conocido para la hepatitis C.   Pruebas para la osteoporosis.** / Por nica vez en mujeres de ms de 65 aos que tengan riesgo de fracturas u osteoporosis.   Autoexamen de piel. / Todos los meses.   Vacuna contra la gripe.** / Todos los aos.   Vacuna antineumocccica de polisacridos.** / Necesitar 1 dosis a los 65 aos (o ms) si nunca se ha vacunado.   Vacuna difteria, ttanos, tos convulsa (Tdap, Td). / Aplquese una dosis de la vacuna DTaP (vacuna contra la tos convulsa para adultos) si tiene ms de 65 aos y est en contacto con un beb, es un trabajador de la salud, es una mujer embarazada o simplemente quiere estar protegido de la enfermedad. Luego necesitar un refuerzo de DT cada 10 aos.   Vacuna contra la varicela.**/ Consltelo con el mdico.   Vacunacin antimeningocccica.** / Consltelo con el mdico.   Vacuna contra la hepatitis A.** / Consltelo con el mdico. 2 dosis, con un intervalo entre 6 a 18 meses.   Vacuna contra la hepatitis B.** / Consulte  con el mdico. 3 dosis en el curso de 6 meses.  **La historia familiar y personal de riesgos y enfermedades puede cambiar las recomendaciones del mdico. Document Released: 10/23/2004 Document Revised: 01/02/2011 [employer] Patient Information 2012 [employer]. 

## 2011-10-16 NOTE — Progress Notes (Signed)
  Subjective:     Crystal [name] is a 76 [id number] female and is here for a comprehensive gynecologic physical exam. The patient reports a small amount of vaginal discharge with odor, notices this after her menstrual period. No pruritus.  History   Social History  . Marital Status: Single    Spouse Name: N/A    Number of Children: N/A  . Years of Education: N/A   Occupational History  . Not on file.   Social History Main Topics  . Smoking status: Never Smoker   . Smokeless tobacco: Never Used  . Alcohol Use: No  . Drug Use: No  . Sexually Active: Yes    Birth Control/ Protection: Surgical   Other Topics Concern  . Not on file   Social History Narrative  . No narrative on file   Health Maintenance  Topic Date Due  . Tetanus/tdap  02/02/1990  . Influenza Vaccine  09/28/2011  . Pap Smear  09/25/2013   The following portions of the patient's history were reviewed and updated as appropriate: allergies, current medications, past family history, past medical history, past social history, past surgical history and problem list.  Review of Systems A comprehensive review of systems was negative.   Objective:   Blood pressure 112/81, pulse 63, temperature 97.9 F (36.6 C), temperature source Oral, height 5\' 2"  (1.575 m), weight 169 lb 3.2 oz (76.749 kg), last menstrual period 09/22/2011. GENERAL: Well-developed, well-nourished female in no acute distress.  HEENT: Normocephalic, atraumatic. Sclerae anicteric.  NECK: Supple. Normal thyroid.  LUNGS: Clear to auscultation bilaterally.  HEART: Regular rate and rhythm. BREASTS: Symmetric with everted nipples. No masses, skin changes, nipple drainage, or lymphadenopathy. ABDOMEN: Soft, nontender, nondistended. No organomegaly. PELVIC: Normal external female genitalia. Vagina is pink and rugated.  Normal discharge. Normal cervix contour. Pap smear obtained. Uterus is normal in size. No adnexal mass or tenderness.  EXTREMITIES:  No cyanosis, clubbing, or edema, 2+ distal pulses.   Assessment:    Healthy female exam.     Plan:    Pap done, follow up results and manage accordingly. Mammogram scholarship application given to patient Routine preventative health maintenance measures emphasized

## 2012-01-25 ENCOUNTER — Emergency Department (HOSPITAL_COMMUNITY)
Admission: EM | Admit: 2012-01-25 | Discharge: 2012-01-25 | Disposition: A | Discharge status: Home / Self Care | Payer: Self-pay | Attending: Emergency Medicine | Admitting: Emergency Medicine

## 2012-01-25 ENCOUNTER — Encounter (HOSPITAL_COMMUNITY): Payer: Self-pay | Admitting: Family Medicine

## 2012-01-25 DIAGNOSIS — M549 Dorsalgia, unspecified: Secondary | ICD-10-CM

## 2012-01-25 DIAGNOSIS — R109 Unspecified abdominal pain: Secondary | ICD-10-CM | POA: Insufficient documentation

## 2012-01-25 DIAGNOSIS — N390 Urinary tract infection, site not specified: Secondary | ICD-10-CM | POA: Insufficient documentation

## 2012-01-25 LAB — URINALYSIS, ROUTINE W REFLEX MICROSCOPIC
Bilirubin Urine: NEGATIVE
Glucose, UA: NEGATIVE mg/dL
Hgb urine dipstick: NEGATIVE
Ketones, ur: NEGATIVE mg/dL
Protein, ur: NEGATIVE mg/dL

## 2012-01-25 MED ORDER — SULFAMETHOXAZOLE-TRIMETHOPRIM 800-160 MG PO TABS: 1.0000 | ORAL_TABLET | Freq: Two times a day (BID) | ORAL | Status: AC

## 2012-01-25 MED ORDER — OXYCODONE-ACETAMINOPHEN 5-325 MG PO TABS: 1.0000 | ORAL_TABLET | Freq: Four times a day (QID) | ORAL | Status: DC | PRN

## 2012-01-25 NOTE — ED Provider Notes (Signed)
History    This chart was scribed for [employer]. Rubin Payor, MD, MD by Smitty Pluck, ED Scribe. The patient was seen in room TR09C and the patient's care was started at 3:49PM.   CSN: [id number]  Arrival date & time 01/25/12  1426      Chief Complaint  Patient presents with  . Back Pain    (Consider location/radiation/quality/duration/timing/severity/associated sxs/prior treatment) The history is provided by the patient. No language interpreter was used.   Yesly Gerety is a 41 y.o. female who presents to the Emergency Department complaining of constant, lower back pain onset 1 week ago. Pt reports that pain is worse at night. Walking aggravates the pain. She reports having moderate lower abdominal pain. Pt denies dysuria, vaginal bleeding, vaginal discharge, urinary incontinence, recent injury, fall, bowel incontinence and any other pain.   Past Medical History  Diagnosis Date  . No pertinent past medical history     Past Surgical History  Procedure Date  . Tubal ligation     7 years ago    Family History  Problem Relation Age of Onset  . Hypertension Mother   . Diabetes Mother   . Heart disease Father   . Anesthesia problems Neg Hx     History  Substance Use Topics  . Smoking status: Never Smoker   . Smokeless tobacco: Never Used  . Alcohol Use: No    OB History    Grav Para Term Preterm Abortions TAB SAB Ect Mult Living   4 4 4      1 5       Review of Systems  Constitutional: Negative for chills.  Respiratory: Negative for shortness of breath.   Gastrointestinal: Positive for abdominal pain. Negative for nausea and vomiting.  Genitourinary: Negative for dysuria, vaginal bleeding and vaginal discharge.  Musculoskeletal: Positive for back pain.  All other systems reviewed and are negative.    Allergies  Review of patient's allergies indicates no known allergies.  Home Medications   Current Outpatient Rx  Name  Route  Sig  Dispense  Refill    . IBUPROFEN 200 MG PO TABS   Oral   Take 400 mg by mouth every 6 (six) hours as needed. For cramping.         . OXYCODONE-ACETAMINOPHEN 5-325 MG PO TABS   Oral   Take 1-2 tablets by mouth every 6 (six) hours as needed for pain.   10 tablet   0   . SULFAMETHOXAZOLE-TRIMETHOPRIM 800-160 MG PO TABS   Oral   Take 1 tablet by mouth 2 (two) times daily.   14 tablet   0     BP 117/73  Pulse 66  Temp 97.9 F (36.6 C) (Oral)  Resp 18  SpO2 100%  LMP 01/11/2012  Physical Exam  Nursing note and vitals reviewed. Constitutional: She is oriented to person, place, and time. She appears well-developed and well-nourished. No distress.  HENT:  Head: Normocephalic and atraumatic.  Eyes: EOM are normal.  Neck: Neck supple. No tracheal deviation present.  Cardiovascular: Normal rate.   Pulmonary/Chest: Effort normal. No respiratory distress.  Abdominal: There is tenderness in the suprapubic area.  Musculoskeletal: Normal range of motion.       Mild pain in right lower back with raising of right leg Lumbar tenderness neurovascularly intact distally   Neurological: She is alert and oriented to person, place, and time.  Skin: Skin is warm and dry.  Psychiatric: She has a normal mood and affect. Her behavior  is normal.    ED Course  Procedures (including critical care time)   COORDINATION OF CARE: 3:53 PM Discussed ED treatment with pt     Labs Reviewed  URINALYSIS, ROUTINE W REFLEX MICROSCOPIC - Abnormal; Notable for the following:    APPearance CLOUDY (*)     Leukocytes, UA LARGE (*)     All other components within normal limits  URINE MICROSCOPIC-ADD ON - Abnormal; Notable for the following:    Squamous Epithelial / LPF FEW (*)     Bacteria, UA MANY (*)     All other components within normal limits  URINE CULTURE   No results found.   1. UTI (urinary tract infection)   2. Back pain       MDM  Patient with suprapubic pain with urinary tract infection. Also back  pain that radiates to legs. Likely combination of urinary tract infection and some lumbar pain. We'll treat with pain medicines and antibiotics.     I personally performed the services described in this documentation, which was scribed in my presence. The recorded information has been reviewed and is accurate.      Juliet Rude. Rubin Payor, MD 01/25/12 [phone]

## 2012-01-25 NOTE — ED Notes (Signed)
Per pt interpretor pt has been having lower back pain radiating down her legs x 1 week. Denies N,V,D. Denies urinary symptoms.

## 2012-01-26 LAB — URINE CULTURE
Colony Count: NO GROWTH
Culture: NO GROWTH

## 2012-06-18 ENCOUNTER — Other Ambulatory Visit: Payer: Self-pay | Admitting: *Deleted

## 2012-06-18 DIAGNOSIS — N63 Unspecified lump in unspecified breast: Secondary | ICD-10-CM

## 2012-06-22 ENCOUNTER — Ambulatory Visit (HOSPITAL_COMMUNITY): Payer: Self-pay

## 2012-06-29 ENCOUNTER — Other Ambulatory Visit: Payer: Self-pay

## 2012-07-12 ENCOUNTER — Other Ambulatory Visit: Payer: Self-pay | Admitting: *Deleted

## 2012-07-12 DIAGNOSIS — N63 Unspecified lump in unspecified breast: Secondary | ICD-10-CM

## 2012-07-13 ENCOUNTER — Ambulatory Visit (HOSPITAL_COMMUNITY): Payer: Self-pay

## 2012-07-15 ENCOUNTER — Ambulatory Visit: Payer: Self-pay | Admitting: Obstetrics and Gynecology

## 2012-07-15 ENCOUNTER — Ambulatory Visit: Payer: Self-pay | Admitting: Obstetrics & Gynecology

## 2012-07-23 ENCOUNTER — Other Ambulatory Visit: Payer: Self-pay

## 2012-07-27 ENCOUNTER — Emergency Department (HOSPITAL_COMMUNITY): Payer: Medicaid Other

## 2012-07-27 ENCOUNTER — Inpatient Hospital Stay (HOSPITAL_COMMUNITY)
Admission: EM | Admit: 2012-07-27 | Discharge: 2012-07-30 | DRG: 419 | Disposition: A | Discharge status: Home / Self Care | Payer: Medicaid Other | Attending: Surgery | Admitting: Surgery

## 2012-07-27 ENCOUNTER — Encounter (HOSPITAL_COMMUNITY): Payer: Self-pay | Admitting: Emergency Medicine

## 2012-07-27 DIAGNOSIS — K8020 Calculus of gallbladder without cholecystitis without obstruction: Secondary | ICD-10-CM

## 2012-07-27 DIAGNOSIS — K8000 Calculus of gallbladder with acute cholecystitis without obstruction: Principal | ICD-10-CM | POA: Diagnosis present

## 2012-07-27 LAB — URINE MICROSCOPIC-ADD ON

## 2012-07-27 LAB — CBC WITH DIFFERENTIAL/PLATELET
Basophils Relative: 0 % (ref 0–1)
Eosinophils Absolute: 0.2 10*3/uL (ref 0.0–0.7)
Eosinophils Relative: 3 % (ref 0–5)
Hemoglobin: 12.6 g/dL (ref 12.0–15.0)
MCH: 29.1 pg (ref 26.0–34.0)
MCHC: 34 g/dL (ref 30.0–36.0)
MCV: 85.7 fL (ref 78.0–100.0)
Monocytes Relative: 5 % (ref 3–12)
Neutrophils Relative %: 57 % (ref 43–77)
Platelets: 329 10*3/uL (ref 150–400)

## 2012-07-27 LAB — URINALYSIS, ROUTINE W REFLEX MICROSCOPIC
Bilirubin Urine: NEGATIVE
Nitrite: NEGATIVE
Specific Gravity, Urine: 1.022 (ref 1.005–1.030)
Urobilinogen, UA: 1 mg/dL (ref 0.0–1.0)
pH: 6 (ref 5.0–8.0)

## 2012-07-27 LAB — POCT PREGNANCY, URINE: Preg Test, Ur: NEGATIVE

## 2012-07-27 LAB — COMPREHENSIVE METABOLIC PANEL
Albumin: 3.7 g/dL (ref 3.5–5.2)
Alkaline Phosphatase: 72 U/L (ref 39–117)
BUN: 15 mg/dL (ref 6–23)
Calcium: 9 mg/dL (ref 8.4–10.5)
GFR calc Af Amer: >90 mL/min (ref >90)
Potassium: 3.5 mEq/L (ref 3.5–5.1)
Total Protein: 7.7 g/dL (ref 6.0–8.3)

## 2012-07-27 LAB — LIPASE, BLOOD: Lipase: 32 U/L (ref 11–59)

## 2012-07-27 MED ORDER — ONDANSETRON HCL 4 MG PO TABS: 4.0000 mg | ORAL_TABLET | Freq: Four times a day (QID) | ORAL | Status: DC

## 2012-07-27 MED ORDER — ONDANSETRON HCL 4 MG/2ML IJ SOLN: 4.0000 mg | Freq: Four times a day (QID) | INTRAMUSCULAR | Status: DC | PRN

## 2012-07-27 MED ORDER — SODIUM CHLORIDE 0.9 % IV SOLN
INTRAVENOUS | Status: DC
  Administered 2012-07-27 – 2012-07-28 (×3): via INTRAVENOUS

## 2012-07-27 MED ORDER — DEXTROSE 5 % IV SOLN
1.0000 g | Freq: Four times a day (QID) | INTRAVENOUS | Status: DC
  Administered 2012-07-28 – 2012-07-30 (×10): 1 g via INTRAVENOUS
  Filled 2012-07-27 (×13): qty 1

## 2012-07-27 MED ORDER — ACETAMINOPHEN 650 MG RE SUPP: 650.0000 mg | Freq: Four times a day (QID) | RECTAL | Status: DC | PRN

## 2012-07-27 MED ORDER — ONDANSETRON HCL 4 MG/2ML IJ SOLN
4.0000 mg | Freq: Once | INTRAMUSCULAR | Status: AC
  Administered 2012-07-27: 4 mg via INTRAVENOUS
  Filled 2012-07-27: qty 2

## 2012-07-27 MED ORDER — MORPHINE SULFATE 4 MG/ML IJ SOLN
4.0000 mg | Freq: Once | INTRAMUSCULAR | Status: AC
  Administered 2012-07-27: 4 mg via INTRAVENOUS
  Filled 2012-07-27: qty 1

## 2012-07-27 MED ORDER — HEPARIN SODIUM (PORCINE) 5000 UNIT/ML IJ SOLN
5000.0000 [IU] | Freq: Three times a day (TID) | INTRAMUSCULAR | Status: DC
  Administered 2012-07-27 – 2012-07-28 (×2): 5000 [IU] via SUBCUTANEOUS
  Filled 2012-07-27 (×5): qty 1

## 2012-07-27 MED ORDER — MORPHINE SULFATE 2 MG/ML IJ SOLN
2.0000 mg | INTRAMUSCULAR | Status: DC | PRN
  Administered 2012-07-27 – 2012-07-29 (×7): 2 mg via INTRAVENOUS
  Filled 2012-07-27 (×7): qty 1

## 2012-07-27 MED ORDER — ACETAMINOPHEN 325 MG PO TABS: 650.0000 mg | ORAL_TABLET | Freq: Four times a day (QID) | ORAL | Status: DC | PRN

## 2012-07-27 MED ORDER — HYDROCODONE-ACETAMINOPHEN 5-325 MG PO TABS: 1.0000 | ORAL_TABLET | ORAL | Status: DC | PRN

## 2012-07-27 NOTE — ED Notes (Signed)
Pt c/o epigastric pain with radiation to lower abd x 3 months; pt sts worse after eating

## 2012-07-27 NOTE — H&P (Signed)
Crystal [name] is an 42 y.o. female.   Chief Complaint: abdominal pain, consult from NP Teressa Lower HPI: 36 yof who presents with 3 months history of ruq pain that has worsened over time and become unbearable. She has received morphine so is tolerable now.  She states she had subjective fever yesterday and did not sleep at all last night. She has some nausea associated with it.  She is having bms.  Made worse with food and movements.  Better with morphine. Underwent u/s with 3 cm gallstone and I was asked to see her.  Past Medical History  Diagnosis Date  . No pertinent past medical history     Past Surgical History  Procedure Laterality Date  . Tubal ligation      7 years ago    Family History  Problem Relation Age of Onset  . Hypertension Mother   . Diabetes Mother   . Heart disease Father   . Anesthesia problems Neg Hx    Social History:  reports that she has never smoked. She has never used smokeless tobacco. She reports that she does not drink alcohol or use illicit drugs.  Allergies: No Known Allergies  Meds none  Results for orders placed during the hospital encounter of 07/27/12 (from the past 48 hour(s))  CBC WITH DIFFERENTIAL     Status: None   Collection Time    07/27/12  5:44 PM      Result Value Range   WBC 6.9  4.0 - 10.5 K/uL   RBC 4.33  3.87 - 5.11 MIL/uL   Hemoglobin 12.6  12.0 - 15.0 g/dL   HCT 16.1  09.6 - 04.5 %   MCV 85.7  78.0 - 100.0 fL   MCH 29.1  26.0 - 34.0 pg   MCHC 34.0  30.0 - 36.0 g/dL   RDW 40.9  81.1 - 91.4 %   Platelets 329  150 - 400 K/uL   Neutrophils Relative % 57  43 - 77 %   Neutro Abs 4.0  1.7 - 7.7 K/uL   Lymphocytes Relative 35  12 - 46 %   Lymphs Abs 2.4  0.7 - 4.0 K/uL   Monocytes Relative 5  3 - 12 %   Monocytes Absolute 0.3  0.1 - 1.0 K/uL   Eosinophils Relative 3  0 - 5 %   Eosinophils Absolute 0.2  0.0 - 0.7 K/uL   Basophils Relative 0  0 - 1 %   Basophils Absolute 0.0  0.0 - 0.1 K/uL  COMPREHENSIVE  METABOLIC PANEL     Status: Abnormal   Collection Time    07/27/12  5:44 PM      Result Value Range   Sodium 136  135 - 145 mEq/L   Potassium 3.5  3.5 - 5.1 mEq/L   Chloride 101  96 - 112 mEq/L   CO2 25  19 - 32 mEq/L   Glucose, Bld 120 (*) 70 - 99 mg/dL   BUN 15  6 - 23 mg/dL   Creatinine, Ser 7.82  0.50 - 1.10 mg/dL   Calcium 9.0  8.4 - 95.6 mg/dL   Total Protein 7.7  6.0 - 8.3 g/dL   Albumin 3.7  3.5 - 5.2 g/dL   AST 20  0 - 37 U/L   ALT 18  0 - 35 U/L   Alkaline Phosphatase 72  39 - 117 U/L   Total Bilirubin 0.2 (*) 0.3 - 1.2 mg/dL   GFR calc non Af  Amer >90  >90 mL/min   GFR calc Af Amer >90  >90 mL/min   Comment:            The eGFR has been calculated     using the CKD EPI equation.     This calculation has not been     validated in all clinical     situations.     eGFR's persistently     <90 mL/min signify     possible Chronic Kidney Disease.  LIPASE, BLOOD     Status: None   Collection Time    07/27/12  5:44 PM      Result Value Range   Lipase 32  11 - 59 U/L  URINALYSIS, ROUTINE W REFLEX MICROSCOPIC     Status: Abnormal   Collection Time    07/27/12  5:56 PM      Result Value Range   Color, Urine YELLOW  YELLOW   APPearance CLOUDY (*) CLEAR   Specific Gravity, Urine 1.022  1.005 - 1.030   pH 6.0  5.0 - 8.0   Glucose, UA NEGATIVE  NEGATIVE mg/dL   Hgb urine dipstick NEGATIVE  NEGATIVE   Bilirubin Urine NEGATIVE  NEGATIVE   Ketones, ur NEGATIVE  NEGATIVE mg/dL   Protein, ur NEGATIVE  NEGATIVE mg/dL   Urobilinogen, UA 1.0  0.0 - 1.0 mg/dL   Nitrite NEGATIVE  NEGATIVE   Leukocytes, UA MODERATE (*) NEGATIVE  URINE MICROSCOPIC-ADD ON     Status: Abnormal   Collection Time    07/27/12  5:56 PM      Result Value Range   Squamous Epithelial / LPF MANY (*) RARE   WBC, UA 0-2  <3 WBC/hpf   RBC / HPF 0-2  <3 RBC/hpf   Bacteria, UA RARE  RARE  POCT PREGNANCY, URINE     Status: None   Collection Time    07/27/12  6:07 PM      Result Value Range   Preg Test,  Ur NEGATIVE  NEGATIVE   Comment:            THE SENSITIVITY OF THIS     METHODOLOGY IS >24 mIU/mL   US Abdomen Complete  07/27/2012   *RADIOLOGY REPORT*  Clinical Data:  Right upper quadrant pain.  ABDOMINAL ULTRASOUND COMPLETE  Comparison:  None.  Findings:  Gallbladder:  There is a 3 cm stone within the lumen of the gallbladder.  Gallbladder wall is upper limits of normal measuring 3 mm in thickness.  Negative sonographic Murphy's sign.  No pericholecystic fluid.  Common Bile Duct:  Within normal limits in caliber.  Liver: No focal mass lesion identified.  Within normal limits in parenchymal echogenicity.  IVC:  Appears normal.  Pancreas:  No abnormality identified.  Spleen:  Within normal limits in size and echotexture.  Right kidney:  Normal in size and parenchymal echogenicity.  No evidence of mass or hydronephrosis.  Left kidney:  Normal in size and parenchymal echogenicity.  No evidence of mass or hydronephrosis.  Abdominal Aorta:  No aneurysm identified.  IMPRESSION: 1.  Large gallstone. 2. Gallbladder wall is mildly thickened measuring 3 mm.  Cannot rule out early acute cholecystitis.   Original Report Authenticated By: Signa Kell, M.D.    Review of Systems  Constitutional: Positive for fever. Negative for chills and weight loss.  Respiratory: Negative for cough and shortness of breath.   Cardiovascular: Negative for chest pain.  Gastrointestinal: Positive for nausea, vomiting and abdominal pain. Negative for  diarrhea and constipation.    Blood pressure 114/78, pulse 57, temperature 98.1 F (36.7 C), temperature source Oral, resp. rate 16, SpO2 99.00%. Physical Exam  Vitals reviewed. Constitutional: She is oriented to person, place, and time. She appears well-developed and well-nourished.  Eyes: No scleral icterus.  Neck: Neck supple.  Cardiovascular: Normal rate, regular rhythm and normal heart sounds.   Respiratory: Effort normal and breath sounds normal. She has no wheezes. She  has no rales.  GI: Soft. Bowel sounds are normal. She exhibits no distension. There is tenderness in the right upper quadrant. There is negative Murphy's sign. No hernia.    Musculoskeletal: Normal range of motion.  Lymphadenopathy:    She has no cervical adenopathy.  Neurological: She is alert and oriented to person, place, and time.     Assessment/Plan Symptomatic cholelithiasis  She clearly has sx cholelithiasis.  Her pain has not completely gone away and is mildly tender on exam.  i don't think she will be able to go home and treated as outpatient.  Will admit and plan for lap chole during this admission.  We discussed surgery and postop course.  I told her she would meet one of my partners tomorrow to perform her surgery  [facility] 07/27/2012, 8:08 PM

## 2012-07-27 NOTE — ED Provider Notes (Signed)
History    CSN: [id number] Arrival date & time 07/27/12  1737  First MD Initiated Contact with Patient 07/27/12 1756     Chief Complaint  Patient presents with  . Abdominal Pain   (Consider location/radiation/quality/duration/timing/severity/associated sxs/prior Treatment) HPI Comments: Pt states that she is having ruq pain that has been going on for the last couple of months:pt states that the pain worsened in the last day:pt states that the pain is worse with eating  Patient is a 42 y.o. female presenting with abdominal pain. The history is provided by the patient. A language interpreter was used.  Abdominal Pain This is a new problem. The current episode started more than 1 month ago. The problem occurs intermittently. The problem has been gradually worsening. Associated symptoms include abdominal pain and nausea. Pertinent negatives include no fever or vomiting. The symptoms are aggravated by eating. She has tried nothing for the symptoms.   Past Medical History  Diagnosis Date  . No pertinent past medical history    Past Surgical History  Procedure Laterality Date  . Tubal ligation      7 years ago   Family History  Problem Relation Age of Onset  . Hypertension Mother   . Diabetes Mother   . Heart disease Father   . Anesthesia problems Neg Hx    History  Substance Use Topics  . Smoking status: Never Smoker   . Smokeless tobacco: Never Used  . Alcohol Use: No   OB History   Grav Para Term Preterm Abortions TAB SAB Ect Mult Living   4 4 4      1 5      Review of Systems  Constitutional: Negative for fever.  Respiratory: Negative.   Cardiovascular: Negative.   Gastrointestinal: Positive for nausea and abdominal pain. Negative for vomiting.    Allergies  Review of patient's allergies indicates no known allergies.  Home Medications  No current outpatient prescriptions on file. BP 104/68  Pulse 66  Temp(Src) 98.1 F (36.7 C) (Oral)  Resp 16  SpO2  99% Physical Exam  Nursing note and vitals reviewed. Constitutional: She is oriented to person, place, and time. She appears well-developed and well-nourished.  HENT:  Head: Normocephalic.  Eyes: Conjunctivae and EOM are normal.  Neck: Normal range of motion. Neck supple.  Cardiovascular: Normal rate and regular rhythm.   Pulmonary/Chest: Effort normal and breath sounds normal.  Abdominal: Soft. Bowel sounds are normal. There is tenderness in the right upper quadrant and epigastric area.  Musculoskeletal: Normal range of motion.  Neurological: She is alert and oriented to person, place, and time.  Skin: Skin is warm and dry.  Psychiatric: She has a normal mood and affect.    ED Course  Procedures (including critical care time) Labs Reviewed  COMPREHENSIVE METABOLIC PANEL - Abnormal; Notable for the following:    Glucose, Bld 120 (*)    Total Bilirubin 0.2 (*)    All other components within normal limits  URINALYSIS, ROUTINE W REFLEX MICROSCOPIC - Abnormal; Notable for the following:    APPearance CLOUDY (*)    Leukocytes, UA MODERATE (*)    All other components within normal limits  URINE MICROSCOPIC-ADD ON - Abnormal; Notable for the following:    Squamous Epithelial / LPF MANY (*)    All other components within normal limits  CBC WITH DIFFERENTIAL  LIPASE, BLOOD  POCT PREGNANCY, URINE   US Abdomen Complete  07/27/2012   *RADIOLOGY REPORT*  Clinical Data:  Right upper  quadrant pain.  ABDOMINAL ULTRASOUND COMPLETE  Comparison:  None.  Findings:  Gallbladder:  There is a 3 cm stone within the lumen of the gallbladder.  Gallbladder wall is upper limits of normal measuring 3 mm in thickness.  Negative sonographic Murphy's sign.  No pericholecystic fluid.  Common Bile Duct:  Within normal limits in caliber.  Liver: No focal mass lesion identified.  Within normal limits in parenchymal echogenicity.  IVC:  Appears normal.  Pancreas:  No abnormality identified.  Spleen:  Within normal  limits in size and echotexture.  Right kidney:  Normal in size and parenchymal echogenicity.  No evidence of mass or hydronephrosis.  Left kidney:  Normal in size and parenchymal echogenicity.  No evidence of mass or hydronephrosis.  Abdominal Aorta:  No aneurysm identified.  IMPRESSION: 1.  Large gallstone. 2. Gallbladder wall is mildly thickened measuring 3 mm.  Cannot rule out early acute cholecystitis.   Original Report Authenticated By: Signa Kell, M.D.   1. Cholelithiases     MDM  Pt feeling better after morphine dose:pt to be seen by Dr. Dwain Sarna with surgery evaluated pt and pt is come if for surgery and pain control  Teressa Lower, NP 07/27/12 2011

## 2012-07-28 ENCOUNTER — Inpatient Hospital Stay (HOSPITAL_COMMUNITY): Payer: Medicaid Other | Admitting: Anesthesiology

## 2012-07-28 ENCOUNTER — Encounter (HOSPITAL_COMMUNITY): Admission: EM | Disposition: A | Discharge status: Home / Self Care | Payer: Self-pay | Source: Home / Self Care

## 2012-07-28 ENCOUNTER — Inpatient Hospital Stay (HOSPITAL_COMMUNITY): Payer: Medicaid Other

## 2012-07-28 ENCOUNTER — Encounter (HOSPITAL_COMMUNITY): Payer: Self-pay | Admitting: Anesthesiology

## 2012-07-28 DIAGNOSIS — K8010 Calculus of gallbladder with chronic cholecystitis without obstruction: Secondary | ICD-10-CM

## 2012-07-28 HISTORY — PX: CHOLECYSTECTOMY: SHX55

## 2012-07-28 LAB — MRSA PCR SCREENING: MRSA by PCR: NEGATIVE

## 2012-07-28 SURGERY — LAPAROSCOPIC CHOLECYSTECTOMY WITH INTRAOPERATIVE CHOLANGIOGRAM
Anesthesia: General | Site: Abdomen | Wound class: Clean Contaminated

## 2012-07-28 MED ORDER — LIDOCAINE HCL 4 % MT SOLN
OROMUCOSAL | Status: DC | PRN
  Administered 2012-07-28: 4 mL via TOPICAL

## 2012-07-28 MED ORDER — HYDROMORPHONE HCL PF 1 MG/ML IJ SOLN
0.2500 mg | INTRAMUSCULAR | Status: DC | PRN
  Administered 2012-07-28 (×2): 0.5 mg via INTRAVENOUS

## 2012-07-28 MED ORDER — LIDOCAINE HCL (CARDIAC) 20 MG/ML IV SOLN
INTRAVENOUS | Status: DC | PRN
  Administered 2012-07-28: 100 mg via INTRAVENOUS

## 2012-07-28 MED ORDER — MIDAZOLAM HCL 2 MG/2ML IJ SOLN: 1.0000 mg | INTRAMUSCULAR | Status: DC | PRN

## 2012-07-28 MED ORDER — NEOSTIGMINE METHYLSULFATE 1 MG/ML IJ SOLN
INTRAMUSCULAR | Status: DC | PRN
  Administered 2012-07-28: 4 mg via INTRAVENOUS

## 2012-07-28 MED ORDER — MIDAZOLAM HCL 5 MG/5ML IJ SOLN
INTRAMUSCULAR | Status: DC | PRN
  Administered 2012-07-28: 2 mg via INTRAVENOUS

## 2012-07-28 MED ORDER — HYDROMORPHONE HCL PF 1 MG/ML IJ SOLN
INTRAMUSCULAR | Status: AC
  Filled 2012-07-28: qty 2

## 2012-07-28 MED ORDER — LACTATED RINGERS IV SOLN
INTRAVENOUS | Status: DC
  Administered 2012-07-28: 13:00:00 via INTRAVENOUS

## 2012-07-28 MED ORDER — 0.9 % SODIUM CHLORIDE (POUR BTL) OPTIME
TOPICAL | Status: DC | PRN
  Administered 2012-07-28: 1000 mL

## 2012-07-28 MED ORDER — OXYCODONE HCL 5 MG/5ML PO SOLN: 5.0000 mg | Freq: Once | ORAL | Status: DC | PRN

## 2012-07-28 MED ORDER — ROCURONIUM BROMIDE 100 MG/10ML IV SOLN
INTRAVENOUS | Status: DC | PRN
  Administered 2012-07-28: 40 mg via INTRAVENOUS

## 2012-07-28 MED ORDER — PROPOFOL 10 MG/ML IV BOLUS
INTRAVENOUS | Status: DC | PRN
  Administered 2012-07-28: 180 mg via INTRAVENOUS

## 2012-07-28 MED ORDER — BUPIVACAINE-EPINEPHRINE 0.25% -1:200000 IJ SOLN
INTRAMUSCULAR | Status: DC | PRN
  Administered 2012-07-28: 13 mL

## 2012-07-28 MED ORDER — SODIUM CHLORIDE 0.9 % IV SOLN
INTRAVENOUS | Status: DC | PRN
  Administered 2012-07-28: 13:00:00

## 2012-07-28 MED ORDER — PHENYLEPHRINE HCL 10 MG/ML IJ SOLN
INTRAMUSCULAR | Status: DC | PRN
  Administered 2012-07-28: 80 ug via INTRAVENOUS

## 2012-07-28 MED ORDER — OXYCODONE-ACETAMINOPHEN 5-325 MG PO TABS
1.0000 | ORAL_TABLET | ORAL | Status: DC | PRN
  Administered 2012-07-29 – 2012-07-30 (×5): 1 via ORAL
  Filled 2012-07-28 (×5): qty 1

## 2012-07-28 MED ORDER — ONDANSETRON HCL 4 MG/2ML IJ SOLN
INTRAMUSCULAR | Status: DC | PRN
  Administered 2012-07-28: 4 mg via INTRAVENOUS

## 2012-07-28 MED ORDER — OXYCODONE HCL 5 MG PO TABS: 5.0000 mg | ORAL_TABLET | Freq: Once | ORAL | Status: DC | PRN

## 2012-07-28 MED ORDER — GLYCOPYRROLATE 0.2 MG/ML IJ SOLN
INTRAMUSCULAR | Status: DC | PRN
  Administered 2012-07-28: 0.6 mg via INTRAVENOUS

## 2012-07-28 MED ORDER — PROMETHAZINE HCL 25 MG/ML IJ SOLN: 6.2500 mg | INTRAMUSCULAR | Status: DC | PRN

## 2012-07-28 MED ORDER — SODIUM CHLORIDE 0.9 % IR SOLN
Status: DC | PRN
  Administered 2012-07-28: 1000 mL

## 2012-07-28 MED ORDER — FENTANYL CITRATE 0.05 MG/ML IJ SOLN: 50.0000 ug | Freq: Once | INTRAMUSCULAR | Status: DC

## 2012-07-28 MED ORDER — FENTANYL CITRATE 0.05 MG/ML IJ SOLN
INTRAMUSCULAR | Status: DC | PRN
  Administered 2012-07-28: 50 ug via INTRAVENOUS
  Administered 2012-07-28: 100 ug via INTRAVENOUS

## 2012-07-28 SURGICAL SUPPLY — 46 items
APPLIER CLIP ROT 10 11.4 M/L (STAPLE) ×2
BENZOIN TINCTURE PRP APPL 2/3 (GAUZE/BANDAGES/DRESSINGS) ×2 IMPLANT
BLADE SURG ROTATE 9660 (MISCELLANEOUS) IMPLANT
CANISTER SUCTION 2500CC (MISCELLANEOUS) ×2 IMPLANT
CHLORAPREP W/TINT 26ML (MISCELLANEOUS) ×2 IMPLANT
CLIP APPLIE ROT 10 11.4 M/L (STAPLE) ×1 IMPLANT
CLOTH BEACON ORANGE TIMEOUT ST (SAFETY) ×2 IMPLANT
COVER MAYO STAND STRL (DRAPES) ×2 IMPLANT
COVER SURGICAL LIGHT HANDLE (MISCELLANEOUS) ×2 IMPLANT
DECANTER SPIKE VIAL GLASS SM (MISCELLANEOUS) ×4 IMPLANT
DRAPE C-ARM 42X72 X-RAY (DRAPES) ×2 IMPLANT
DRAPE UTILITY 15X26 W/TAPE STR (DRAPE) ×4 IMPLANT
DRSG TEGADERM 4X4.75 (GAUZE/BANDAGES/DRESSINGS) ×2 IMPLANT
ELECT REM PT RETURN 9FT ADLT (ELECTROSURGICAL) ×2
ELECTRODE REM PT RTRN 9FT ADLT (ELECTROSURGICAL) ×1 IMPLANT
FILTER SMOKE EVAC LAPAROSHD (FILTER) ×2 IMPLANT
GAUZE SPONGE 2X2 8PLY STRL LF (GAUZE/BANDAGES/DRESSINGS) ×1 IMPLANT
GLOVE BIO SURGEON STRL SZ7 (GLOVE) ×2 IMPLANT
GLOVE BIO SURGEON STRL SZ8 (GLOVE) ×2 IMPLANT
GLOVE BIOGEL PI IND STRL 7.0 (GLOVE) ×1 IMPLANT
GLOVE BIOGEL PI IND STRL 7.5 (GLOVE) ×1 IMPLANT
GLOVE BIOGEL PI IND STRL 8 (GLOVE) ×1 IMPLANT
GLOVE BIOGEL PI INDICATOR 7.0 (GLOVE) ×1
GLOVE BIOGEL PI INDICATOR 7.5 (GLOVE) ×1
GLOVE BIOGEL PI INDICATOR 8 (GLOVE) ×1
GLOVE SURG SS PI 8.0 STRL IVOR (GLOVE) ×2 IMPLANT
GOWN STRL NON-REIN LRG LVL3 (GOWN DISPOSABLE) ×8 IMPLANT
KIT BASIN OR (CUSTOM PROCEDURE TRAY) ×2 IMPLANT
KIT ROOM TURNOVER OR (KITS) ×2 IMPLANT
NS IRRIG 1000ML POUR BTL (IV SOLUTION) ×2 IMPLANT
PAD ARMBOARD 7.5X6 YLW CONV (MISCELLANEOUS) ×2 IMPLANT
POUCH SPECIMEN RETRIEVAL 10MM (ENDOMECHANICALS) ×2 IMPLANT
SCISSORS LAP 5X35 DISP (ENDOMECHANICALS) IMPLANT
SET CHOLANGIOGRAPH 5 50 .035 (SET/KITS/TRAYS/PACK) ×2 IMPLANT
SET IRRIG TUBING LAPAROSCOPIC (IRRIGATION / IRRIGATOR) ×2 IMPLANT
SLEEVE ENDOPATH XCEL 5M (ENDOMECHANICALS) ×2 IMPLANT
SPECIMEN JAR SMALL (MISCELLANEOUS) ×2 IMPLANT
SPONGE GAUZE 2X2 STER 10/PKG (GAUZE/BANDAGES/DRESSINGS) ×1
SUT MNCRL AB 4-0 PS2 18 (SUTURE) ×2 IMPLANT
SUT VICRYL 0 UR6 27IN ABS (SUTURE) ×2 IMPLANT
TOWEL OR 17X24 6PK STRL BLUE (TOWEL DISPOSABLE) ×2 IMPLANT
TOWEL OR 17X26 10 PK STRL BLUE (TOWEL DISPOSABLE) ×2 IMPLANT
TRAY LAPAROSCOPIC (CUSTOM PROCEDURE TRAY) ×2 IMPLANT
TROCAR XCEL BLUNT TIP 100MML (ENDOMECHANICALS) ×2 IMPLANT
TROCAR XCEL NON-BLD 11X100MML (ENDOMECHANICALS) ×2 IMPLANT
TROCAR XCEL NON-BLD 5MMX100MML (ENDOMECHANICALS) ×2 IMPLANT

## 2012-07-28 NOTE — Anesthesia Preprocedure Evaluation (Addendum)
Anesthesia Evaluation  Patient identified by MRN, date of birth, ID band Patient awake    Reviewed: Allergy & Precautions, H&P , NPO status , Patient's Chart, lab work & pertinent test results  Airway Mallampati: I TM Distance: >3 FB Neck ROM: Full    Dental   Pulmonary  breath sounds clear to auscultation        Cardiovascular Rhythm:Regular Rate:Normal     Neuro/Psych    GI/Hepatic   Endo/Other    Renal/GU      Musculoskeletal   Abdominal   Peds  Hematology   Anesthesia Other Findings   Reproductive/Obstetrics                           Anesthesia Physical Anesthesia Plan  ASA: I  Anesthesia Plan: General   Post-op Pain Management:    Induction: Intravenous  Airway Management Planned: Oral ETT  Additional Equipment:   Intra-op Plan:   Post-operative Plan: Extubation in OR  Informed Consent: I have reviewed the patients History and Physical, chart, labs and discussed the procedure including the risks, benefits and alternatives for the proposed anesthesia with the patient or authorized representative who has indicated his/her understanding and acceptance.     Plan Discussed with: CRNA and Surgeon  Anesthesia Plan Comments:         Anesthesia Quick Evaluation  

## 2012-07-28 NOTE — Transfer of Care (Signed)
Immediate Anesthesia Transfer of Care Note  Patient: Crystal [name]  Procedure(s) Performed: Procedure(s): LAPAROSCOPIC CHOLECYSTECTOMY WITH INTRAOPERATIVE CHOLANGIOGRAM (N/A)  Patient Location: PACU  Anesthesia Type:General  Level of Consciousness: sedated and patient cooperative  Airway & Oxygen Therapy: Patient Spontanous Breathing and Patient connected to nasal cannula oxygen  Post-op Assessment: Report given to PACU RN, Post -op Vital signs reviewed and stable and Patient moving all extremities  Post vital signs: Reviewed and stable  Complications: No apparent anesthesia complications

## 2012-07-28 NOTE — Anesthesia Procedure Notes (Signed)
Procedure Name: Intubation Date/Time: 07/28/2012 1:26 PM Performed by: Sharlene Dory E Pre-anesthesia Checklist: Patient identified, Emergency Drugs available, Suction available, Patient being monitored and Timeout performed Patient Re-evaluated:Patient Re-evaluated prior to inductionOxygen Delivery Method: Circle system utilized Preoxygenation: Pre-oxygenation with 100% oxygen Intubation Type: IV induction Ventilation: Mask ventilation without difficulty Laryngoscope Size: Mac and 3 Grade View: Grade II Tube type: Oral Tube size: 7.0 mm Number of attempts: 1 Airway Equipment and Method: Stylet and LTA kit utilized Placement Confirmation: ETT inserted through vocal cords under direct vision,  positive ETCO2 and breath sounds checked- equal and bilateral Secured at: 21 cm Tube secured with: Tape Dental Injury: Teeth and Oropharynx as per pre-operative assessment

## 2012-07-28 NOTE — Progress Notes (Signed)
Will plan laparoscopic cholecystectomy with intraoperative cholangiogram later today.  The surgical procedure has been discussed with the patient.  Potential risks, benefits, alternative treatments, and expected outcomes have been explained.  All of the patient's questions at this time have been answered.  The likelihood of reaching the patient's treatment goal is good.  The patient understand the proposed surgical procedure and wishes to proceed.  Crystal [name]. Corliss Skains, MD, [facility]  General/ Trauma Surgery  07/28/2012 7:56 AM

## 2012-07-28 NOTE — Anesthesia Postprocedure Evaluation (Signed)
  Anesthesia Post-op Note  Patient: Crystal [name]  Procedure(s) Performed: Procedure(s): LAPAROSCOPIC CHOLECYSTECTOMY WITH INTRAOPERATIVE CHOLANGIOGRAM (N/A)  Patient Location: PACU  Anesthesia Type:General  Level of Consciousness: awake  Airway and Oxygen Therapy: Patient Spontanous Breathing  Post-op Pain: mild  Post-op Assessment: Post-op Vital signs reviewed, Patient's Cardiovascular Status Stable, Respiratory Function Stable, Patent Airway, No signs of Nausea or vomiting and Pain level controlled  Post-op Vital Signs: stable  Complications: No apparent anesthesia complications

## 2012-07-28 NOTE — Progress Notes (Signed)
  Subjective: Hispanic speaking female, offered formal translator, but the patient declined.  Translation provided by family. Pt. States she feels dizzy upon standing, otherwise doing okay.  Denies further abdominal pain.  She is NPO.    Objective: Vital signs in last 24 hours: Temp:  [98.1 F (36.7 C)-98.4 F (36.9 C)] 98.4 F (36.9 C) (07/02 0549) Pulse Rate:  [57-66] 60 (07/02 0549) Resp:  [16-18] 18 (07/02 0549) BP: (104-121)/(61-78) 106/61 mmHg (07/02 0549) SpO2:  [86 %-100 %] 98 % (07/02 0549) Weight:  [168 lb 3.2 oz (76.295 kg)] 168 lb 3.2 oz (76.295 kg) (07/01 2105) Last BM Date: 07/25/12  Intake/Output from previous day: 07/01 0701 - 07/02 0700 In: 726 [I.V.:726] Out: -  Intake/Output this shift:    General appearance: alert, cooperative, appears stated age and no distress Resp: clear to auscultation bilaterally, no wheezes or crackles.  No cyanosis.   Cardio: regular rate and rhythm, S1, S2 normal, no murmur, click, rub or gallop.  +2 pulses, no edema. GI: soft, round and diffuse tenderness to palpation, negative murphys sign.  No hernias or masses.  No organomegaly  Lab Results:   Recent Labs  07/27/12 1744  WBC 6.9  HGB 12.6  HCT 37.1  PLT 329   BMET  Recent Labs  07/27/12 1744  NA 136  K 3.5  CL 101  CO2 25  GLUCOSE 120*  BUN 15  CREATININE 0.54  CALCIUM 9.0   PT/INR No results found for this basename: LABPROT, INR,  in the last 72 hours ABG No results found for this basename: PHART, PCO2, PO2, HCO3,  in the last 72 hours  Studies/Results: US Abdomen Complete  07/27/2012   *RADIOLOGY REPORT*  Clinical Data:  Right upper quadrant pain.  ABDOMINAL ULTRASOUND COMPLETE  Comparison:  None.  Findings:  Gallbladder:  There is a 3 cm stone within the lumen of the gallbladder.  Gallbladder wall is upper limits of normal measuring 3 mm in thickness.  Negative sonographic Murphy's sign.  No pericholecystic fluid.  Common Bile Duct:  Within normal limits  in caliber.  Liver: No focal mass lesion identified.  Within normal limits in parenchymal echogenicity.  IVC:  Appears normal.  Pancreas:  No abnormality identified.  Spleen:  Within normal limits in size and echotexture.  Right kidney:  Normal in size and parenchymal echogenicity.  No evidence of mass or hydronephrosis.  Left kidney:  Normal in size and parenchymal echogenicity.  No evidence of mass or hydronephrosis.  Abdominal Aorta:  No aneurysm identified.  IMPRESSION: 1.  Large gallstone. 2. Gallbladder wall is mildly thickened measuring 3 mm.  Cannot rule out early acute cholecystitis.   Original Report Authenticated By: Signa Kell, M.D.    Anti-infectives: Anti-infectives   Start     Dose/Rate Route Frequency Ordered Stop   07/28/12 0000  cefOXitin (MEFOXIN) 1 g in dextrose 5 % 50 mL IVPB     1 g 100 mL/hr over 30 Minutes Intravenous 4 times per day 07/27/12 2106        Assessment/Plan: Symptomatic cholelithiasis Her symptoms have improved, but has been having symptoms for the past 3 months.  Will continue to keep NPO until further discussion with attending.   -IVF -Cefoxitin -Pain control and antiemetics  VTE prophylaxis -pt received heparin at 0656, hold afternoon dose -SCDs, ambulate   LOS: 1 day    Bonner Puna [facility] ANP-BC Pager [phone]  07/28/2012 7:27 AM

## 2012-07-28 NOTE — Preoperative (Signed)
Beta Blockers   Reason not to administer Beta Blockers:Not Applicable 

## 2012-07-28 NOTE — Op Note (Signed)
Laparoscopic Cholecystectomy with IOC Procedure Note  Indications: This patient presents with symptomatic gallbladder disease and will undergo laparoscopic cholecystectomy.  Pre-operative Diagnosis: Calculus of gallbladder with acute cholecystitis, without mention of obstruction  Post-operative Diagnosis: Same  Surgeon: Esaiah Wanless K.   Assistants: Luisa Hart, MD  Anesthesia: General endotracheal anesthesia  ASA Class: 2  Procedure Details  The patient was seen again in the Holding Room. The risks, benefits, complications, treatment options, and expected outcomes were discussed with the patient. The possibilities of reaction to medication, pulmonary aspiration, perforation of viscus, bleeding, recurrent infection, finding a normal gallbladder, the need for additional procedures, failure to diagnose a condition, the possible need to convert to an open procedure, and creating a complication requiring transfusion or operation were discussed with the patient. The likelihood of improving the patient's symptoms with return to their baseline status is good.  The patient and/or family concurred with the proposed plan, giving informed consent. The site of surgery properly noted. The patient was taken to Operating Room, identified as Merica Prell and the procedure verified as Laparoscopic Cholecystectomy with Intraoperative Cholangiogram. A Time Out was held and the above information confirmed.  Prior to the induction of general anesthesia, antibiotic prophylaxis was administered. General endotracheal anesthesia was then administered and tolerated well. After the induction, the abdomen was prepped with Chloraprep and draped in the sterile fashion. The patient was positioned in the supine position.  Local anesthetic agent was injected into the skin near the umbilicus and an incision made. We dissected down to the abdominal fascia with blunt dissection.  The fascia was incised vertically and we  entered the peritoneal cavity bluntly.  A pursestring suture of 0-Vicryl was placed around the fascial opening.  The Hasson cannula was inserted and secured with the stay suture.  Pneumoperitoneum was then created with CO2 and tolerated well without any adverse changes in the patient's vital signs. An 11-mm port was placed in the subxiphoid position.  Two 5-mm ports were placed in the right upper quadrant. All skin incisions were infiltrated with a local anesthetic agent before making the incision and placing the trocars.   We positioned the patient in reverse Trendelenburg, tilted slightly to the patient's left.  The gallbladder was identified, the fundus grasped and retracted cephalad. The gallbladder was distended and had an obvious large stone.  Adhesions were lysed bluntly and with the electrocautery where indicated, taking care not to injure any adjacent organs or viscus. The infundibulum was grasped and retracted laterally, exposing the peritoneum overlying the triangle of Calot. This was then divided and exposed in a blunt fashion. A critical view of the cystic duct and cystic artery was obtained.  The cystic duct was clearly identified and bluntly dissected circumferentially. The cystic duct was ligated with a clip distally.   An incision was made in the cystic duct and the [facility] cholangiogram catheter introduced. The catheter was secured using a clip. A cholangiogram was then obtained which showed good visualization of the distal and proximal biliary tree with no sign of filling defects or obstruction.  Contrast flowed easily into the duodenum. The catheter was then removed.   The cystic duct was then ligated with clips and divided. The cystic artery was identified, dissected free, ligated with clips and divided as well.   The gallbladder was dissected from the liver bed in retrograde fashion with the electrocautery. The gallbladder was removed and placed in an Endocatch sac. The liver bed was  irrigated and inspected. Hemostasis was achieved with the  electrocautery. Copious irrigation was utilized and was repeatedly aspirated until clear.  The gallbladder and Endocatch sac were then removed through the umbilical port site.  The pursestring suture was used to close the umbilical fascia.    We again inspected the right upper quadrant for hemostasis.  Pneumoperitoneum was released as we removed the trocars.  4-0 Monocryl was used to close the skin.   Benzoin, steri-strips, and clean dressings were applied. The patient was then extubated and brought to the recovery room in stable condition. Instrument, sponge, and needle counts were correct at closure and at the conclusion of the case.   Findings: Cholecystitis with Cholelithiasis  Estimated Blood Loss: Minimal         Drains: none         Specimens: Gallbladder           Complications: None; patient tolerated the procedure well.         Disposition: PACU - hemodynamically stable.         Condition: stable  Wilmon Arms. Corliss Skains, MD, [facility]  General/ Trauma Surgery  07/28/2012 2:28 PM    \

## 2012-07-29 ENCOUNTER — Encounter (HOSPITAL_COMMUNITY): Payer: Self-pay | Admitting: Surgery

## 2012-07-29 ENCOUNTER — Other Ambulatory Visit: Payer: Self-pay

## 2012-07-29 MED ORDER — OXYCODONE-ACETAMINOPHEN 5-325 MG PO TABS: 1.0000 | ORAL_TABLET | ORAL | Status: DC | PRN

## 2012-07-29 MED ORDER — ACETAMINOPHEN 325 MG PO TABS: 650.0000 mg | ORAL_TABLET | Freq: Four times a day (QID) | ORAL | Status: DC | PRN

## 2012-07-29 NOTE — Progress Notes (Signed)
Home later today.  Wilmon Arms. Corliss Skains, MD, [facility] Surgery  General/ Trauma Surgery  07/29/2012 9:26 AM

## 2012-07-29 NOTE — Progress Notes (Signed)
1 Day Post-Op  Subjective: Pt having some abdominal discomfort, but much improved.  OOB to the restroom.  No flatus or bm yet.  Tolerated clear liquid dinner.  Denies f/c/s.  Denies n/v.  Objective: Vital signs in last 24 hours: Temp:  [98.1 F (36.7 C)-99 F (37.2 C)] 98.1 F (36.7 C) (07/03 0640) Pulse Rate:  [51-75] 75 (07/03 0640) Resp:  [10-20] 18 (07/03 0640) BP: (100-133)/(66-84) 106/67 mmHg (07/03 0640) SpO2:  [95 %-100 %] 100 % (07/03 0640) Last BM Date: 07/25/12  Intake/Output from previous day: 07/02 0701 - 07/03 0700 In: 1478 [I.V.:1478] Out: 50 [Blood:50] Intake/Output this shift:   General appearance: alert, cooperative, appears stated age and no distress  Resp: clear to auscultation bilaterally, no wheezes or crackles. No cyanosis.  Cardio: regular rate and rhythm, S1, S2 normal, no murmur, click, rub or gallop. +2 pulses, no edema.  GI: soft, round and mildly tender over the incision sites.  Sites are clean dry and intact. tegaderm and 2x2 removed leaving steristrips in place.  +BS x4 quadrants.  No HSM.  Lab Results:   Recent Labs  07/27/12 1744  WBC 6.9  HGB 12.6  HCT 37.1  PLT 329   BMET  Recent Labs  07/27/12 1744  NA 136  K 3.5  CL 101  CO2 25  GLUCOSE 120*  BUN 15  CREATININE 0.54  CALCIUM 9.0   Studies/Results: Dg Cholangiogram Operative  07/28/2012   *RADIOLOGY REPORT*  Clinical Data: Cholelithiasis and cholecystitis.  Fluoroscopy time:  10 seconds  INTRAOPERATIVE CHOLANGIOGRAM  Technique:  Multiple fluoroscopic spot radiographs were obtained during intraoperative cholangiogram and are submitted for interpretation post-operatively.  Comparison: Ultrasound dated 07/27/2012  Findings: Common hepatic and common bile duct are well visualized and are normal with free flow of contrast into the duodenum.  No filling defects.  Incomplete visualization of the intrahepatic ducts.  IMPRESSION: Normal intraoperative cholangiogram.   Original Report  Authenticated By: Francene Boyers, M.D.   US Abdomen Complete  07/27/2012   *RADIOLOGY REPORT*  Clinical Data:  Right upper quadrant pain.  ABDOMINAL ULTRASOUND COMPLETE  Comparison:  None.  Findings:  Gallbladder:  There is a 3 cm stone within the lumen of the gallbladder.  Gallbladder wall is upper limits of normal measuring 3 mm in thickness.  Negative sonographic Murphy's sign.  No pericholecystic fluid.  Common Bile Duct:  Within normal limits in caliber.  Liver: No focal mass lesion identified.  Within normal limits in parenchymal echogenicity.  IVC:  Appears normal.  Pancreas:  No abnormality identified.  Spleen:  Within normal limits in size and echotexture.  Right kidney:  Normal in size and parenchymal echogenicity.  No evidence of mass or hydronephrosis.  Left kidney:  Normal in size and parenchymal echogenicity.  No evidence of mass or hydronephrosis.  Abdominal Aorta:  No aneurysm identified.  IMPRESSION: 1.  Large gallstone. 2. Gallbladder wall is mildly thickened measuring 3 mm.  Cannot rule out early acute cholecystitis.   Original Report Authenticated By: Signa Kell, M.D.    Anti-infectives: Anti-infectives   Start     Dose/Rate Route Frequency Ordered Stop   07/28/12 0000  cefOXitin (MEFOXIN) 1 g in dextrose 5 % 50 mL IVPB     1 g 100 mL/hr over 30 Minutes Intravenous 4 times per day 07/27/12 2106        Assessment/Plan: Symptomatic cholelithiasis -Lap chole with IOC(Dr. Corliss Skains 07/28/12) -advance to regular diet -ambulate patient  -pain control -ischarge after breakfast.  Formal summary to follow   LOS: 2 days   Kaelyn Innocent ANP-BC  07/29/2012 7:56 AM

## 2012-07-29 NOTE — Discharge Summary (Signed)
Crystal [name]. Corliss Skains, MD, [facility] Surgery  General/ Trauma Surgery  07/29/2012 9:52 AM

## 2012-07-29 NOTE — ED Provider Notes (Signed)
Medical screening examination/treatment/procedure(s) were performed by non-physician practitioner and as supervising physician I was immediately available for consultation/collaboration.   Melitta Tigue L Fortune Brannigan, MD 07/29/12 2017 

## 2012-07-29 NOTE — Discharge Summary (Signed)
Physician Discharge Summary  Patient ID: Crystal [name] MRN: [id number] DOB/AGE: 1970/02/27 42 y.o.  Admit date: 07/27/2012 Discharge date: 07/29/2012  Admitting Diagnosis: Symptomatic cholelithiasis  Discharge Diagnosis Symptomatic cholelithiasis Laparoscopic cholecystectomy  Consultants none  Imaging: Dg Cholangiogram Operative  07/28/2012   *RADIOLOGY REPORT*  Clinical Data: Cholelithiasis and cholecystitis.  Fluoroscopy time:  10 seconds  INTRAOPERATIVE CHOLANGIOGRAM  Technique:  Multiple fluoroscopic spot radiographs were obtained during intraoperative cholangiogram and are submitted for interpretation post-operatively.  Comparison: Ultrasound dated 07/27/2012  Findings: Common hepatic and common bile duct are well visualized and are normal with free flow of contrast into the duodenum.  No filling defects.  Incomplete visualization of the intrahepatic ducts.  IMPRESSION: Normal intraoperative cholangiogram.   Original Report Authenticated By: Francene Boyers, M.D.   US Abdomen Complete  07/27/2012   *RADIOLOGY REPORT*  Clinical Data:  Right upper quadrant pain.  ABDOMINAL ULTRASOUND COMPLETE  Comparison:  None.  Findings:  Gallbladder:  There is a 3 cm stone within the lumen of the gallbladder.  Gallbladder wall is upper limits of normal measuring 3 mm in thickness.  Negative sonographic Murphy's sign.  No pericholecystic fluid.  Common Bile Duct:  Within normal limits in caliber.  Liver: No focal mass lesion identified.  Within normal limits in parenchymal echogenicity.  IVC:  Appears normal.  Pancreas:  No abnormality identified.  Spleen:  Within normal limits in size and echotexture.  Right kidney:  Normal in size and parenchymal echogenicity.  No evidence of mass or hydronephrosis.  Left kidney:  Normal in size and parenchymal echogenicity.  No evidence of mass or hydronephrosis.  Abdominal Aorta:  No aneurysm identified.  IMPRESSION: 1.  Large gallstone. 2. Gallbladder wall is mildly  thickened measuring 3 mm.  Cannot rule out early acute cholecystitis.   Original Report Authenticated By: Signa Kell, M.D.    Procedures Laparoscopic cholecystectomy with IOC(Dr. Corliss Skains 07/28/12)  Hospital Course:  Mrs. Crystal [name] is a healthy Hispanic speaking female who presented to [facility] with abdominal pain.  Workup showed gallstones and gallbladder thickening.  Patient was admitted and underwent procedure listed above.  Tolerated procedure well and was transferred to the floor.  Diet was advanced as tolerated.  On POD #2, the patient was voiding well, tolerating diet, ambulating well, pain well controlled, vital signs stable, incisions c/d/i and felt stable for discharge home.  Patient will follow up in our office in 2 weeks and knows to call with questions or concerns.    Medication List         acetaminophen 325 MG tablet  Commonly known as:  TYLENOL  Take 2 tablets (650 mg total) by mouth every 6 (six) hours as needed.     oxyCODONE-acetaminophen 5-325 MG per tablet  Commonly known as:  PERCOCET/ROXICET  Take 1 tablet by mouth every 4 (four) hours as needed.             Follow-up Information   Follow up with CCS,MD, MD On 08/10/2012. (APPOINTMENT TIME: 10AM.  PLEASE ARRIVE PRIOR TO APPOINTMENT TIME.  )    Contact information:   [address] [place] [address] [place] [address] [phone]       Signed: Ashok Norris, [facility] Surgery [phone]  07/29/2012, 9:10 AM

## 2012-07-30 NOTE — Progress Notes (Signed)
General Surgery Note  LOS: 3 days  POD -  2 Days Post-Op Room - 6N31  Assessment/Plan: 1. LAPAROSCOPIC CHOLECYSTECTOMY WITH INTRAOPERATIVE CHOLANGIOGRAM - 07/28/2012 - M. Tsuei.  Doing better.  Ready to go home today.  All paperwork already filled out by [employer].  D/C home today.  Subjective:  She stayed one more day because of pain.  But doing better today and ready for discharge.  Her instructions and meds have been written by Ashok Norris.  Her daughter, Lanora Manis, is in the room.  The patient does not speak much [place], but her daughter acts as Nurse, learning disability.  Objective:   Filed Vitals:   07/30/12 0559  BP: 97/61  Pulse: 71  Temp: 98.3 F (36.8 C)  Resp: 18     Intake/Output from previous day:  07/03 0701 - 07/04 0700 In: 1460 [P.O.:840; IV Piggyback:450] Out: -   Intake/Output this shift:      Physical Exam:   General: WN F who is alert and oriented.    HEENT: Normal. Pupils equal. .   Lungs: Clear   Abdomen: Soft   Wound: Clean with steristrips   Lab Results:    Recent Labs  07/27/12 1744  WBC 6.9  HGB 12.6  HCT 37.1  PLT 329    BMET   Recent Labs  07/27/12 1744  NA 136  K 3.5  CL 101  CO2 25  GLUCOSE 120*  BUN 15  CREATININE 0.54  CALCIUM 9.0    PT/INR  No results found for this basename: LABPROT, INR,  in the last 72 hours  ABG  No results found for this basename: PHART, PCO2, PO2, HCO3,  in the last 72 hours   Studies/Results:  Dg Cholangiogram Operative  07/28/2012   *RADIOLOGY REPORT*  Clinical Data: Cholelithiasis and cholecystitis.  Fluoroscopy time:  10 seconds  INTRAOPERATIVE CHOLANGIOGRAM  Technique:  Multiple fluoroscopic spot radiographs were obtained during intraoperative cholangiogram and are submitted for interpretation post-operatively.  Comparison: Ultrasound dated 07/27/2012  Findings: Common hepatic and common bile duct are well visualized and are normal with free flow of contrast into the duodenum.  No filling defects.   Incomplete visualization of the intrahepatic ducts.  IMPRESSION: Normal intraoperative cholangiogram.   Original Report Authenticated By: Francene Boyers, M.D.     Anti-infectives:   Anti-infectives   Start     Dose/Rate Route Frequency Ordered Stop   07/28/12 0000  cefOXitin (MEFOXIN) 1 g in dextrose 5 % 50 mL IVPB     1 g 100 mL/hr over 30 Minutes Intravenous 4 times per day 07/27/12 2106        Ovidio Kin, MD, FACS Pager: [phone],   [facility] Office: [phone] 07/30/2012

## 2012-07-30 NOTE — Progress Notes (Signed)
Pt for discharge to home today.  Rx given today for pain medicine and explained.  Copy of instructions given and explained and FU appt given for CCS for 08/10/12.  Pt has no further questions about home self care.

## 2012-08-01 ENCOUNTER — Encounter (HOSPITAL_COMMUNITY): Payer: Self-pay | Admitting: Emergency Medicine

## 2012-08-01 DIAGNOSIS — Z9089 Acquired absence of other organs: Secondary | ICD-10-CM | POA: Insufficient documentation

## 2012-08-01 DIAGNOSIS — IMO0002 Reserved for concepts with insufficient information to code with codable children: Secondary | ICD-10-CM | POA: Insufficient documentation

## 2012-08-01 DIAGNOSIS — Y838 Other surgical procedures as the cause of abnormal reaction of the patient, or of later complication, without mention of misadventure at the time of the procedure: Secondary | ICD-10-CM | POA: Insufficient documentation

## 2012-08-01 LAB — CBC WITH DIFFERENTIAL/PLATELET
Basophils Absolute: 0 10*3/uL (ref 0.0–0.1)
HCT: 39.8 % (ref 36.0–46.0)
Hemoglobin: 13.8 g/dL (ref 12.0–15.0)
Lymphocytes Relative: 28 % (ref 12–46)
Lymphs Abs: 2.5 10*3/uL (ref 0.7–4.0)
Monocytes Absolute: 0.6 10*3/uL (ref 0.1–1.0)
Monocytes Relative: 7 % (ref 3–12)
Neutro Abs: 5.3 10*3/uL (ref 1.7–7.7)
RBC: 4.64 MIL/uL (ref 3.87–5.11)
WBC: 8.7 10*3/uL (ref 4.0–10.5)

## 2012-08-01 LAB — COMPREHENSIVE METABOLIC PANEL
AST: 32 U/L (ref 0–37)
CO2: 26 mEq/L (ref 19–32)
Chloride: 98 mEq/L (ref 96–112)
Creatinine, Ser: 0.48 mg/dL — ABNORMAL LOW (ref 0.50–1.10)
GFR calc non Af Amer: >90 mL/min (ref >90)
Total Bilirubin: 0.3 mg/dL (ref 0.3–1.2)

## 2012-08-01 NOTE — ED Notes (Signed)
PT. REPORTS PAIN WITH DRAINAGE AT UMBILICAL INCISION ONSET TODAY , S/P CHOLECYSTECTOMY LAST Wednesday .

## 2012-08-02 ENCOUNTER — Emergency Department (HOSPITAL_COMMUNITY)
Admission: EM | Admit: 2012-08-02 | Discharge: 2012-08-02 | Disposition: A | Discharge status: Home / Self Care | Payer: Self-pay | Attending: Emergency Medicine | Admitting: Emergency Medicine

## 2012-08-02 DIAGNOSIS — T792XXA Traumatic secondary and recurrent hemorrhage and seroma, initial encounter: Secondary | ICD-10-CM

## 2012-08-02 MED ORDER — SULFAMETHOXAZOLE-TRIMETHOPRIM 800-160 MG PO TABS: 1.0000 | ORAL_TABLET | Freq: Two times a day (BID) | ORAL | Status: DC

## 2012-08-02 NOTE — ED Provider Notes (Signed)
History    CSN: [id number] Arrival date & time 08/01/12  2221  First MD Initiated Contact with Patient 08/02/12 0112     Chief Complaint  Patient presents with  . Wound Infection   (Consider location/radiation/quality/duration/timing/severity/associated sxs/prior Treatment) HPI Comments: Patient presents to ER for evaluation of drainage from surgical site. Symptoms began today. She has some tenderness at the site. No fever. No diffuse abdominal pain. Patient had laparoscopic cholecystectomy 5 days ago.  The history is provided by the patient. The history is limited by a language barrier. A language interpreter was used.   Past Medical History  Diagnosis Date  . No pertinent past medical history    Past Surgical History  Procedure Laterality Date  . Tubal ligation      7 years ago  . Cholecystectomy N/A 07/28/2012    Procedure: LAPAROSCOPIC CHOLECYSTECTOMY WITH INTRAOPERATIVE CHOLANGIOGRAM;  Surgeon: Wilmon Arms. Corliss Skains, MD;  Location: [facility] OR;  Service: General;  Laterality: N/A;   Family History  Problem Relation Age of Onset  . Hypertension Mother   . Diabetes Mother   . Heart disease Father   . Anesthesia problems Neg Hx    History  Substance Use Topics  . Smoking status: Never Smoker   . Smokeless tobacco: Never Used  . Alcohol Use: No   OB History   Grav Para Term Preterm Abortions TAB SAB Ect Mult Living   4 4 4      1 5      Review of Systems  Skin: Positive for wound.  All other systems reviewed and are negative.    Allergies  Review of patient's allergies indicates no known allergies.  Home Medications   Current Outpatient Rx  Name  Route  Sig  Dispense  Refill  . acetaminophen (TYLENOL) 325 MG tablet   Oral   Take 2 tablets (650 mg total) by mouth every 6 (six) hours as needed.         Marland Kitchen oxyCODONE-acetaminophen (PERCOCET/ROXICET) 5-325 MG per tablet   Oral   Take 1 tablet by mouth every 4 (four) hours as needed.   30 tablet   0    BP 115/71   Pulse 70  Temp(Src) 98.1 F (36.7 C) (Oral)  Resp 18  SpO2 99%  LMP 07/06/2012 Physical Exam  Constitutional: She is oriented to person, place, and time. She appears well-developed and well-nourished. No distress.  HENT:  Head: Normocephalic and atraumatic.  Right Ear: Hearing normal.  Left Ear: Hearing normal.  Nose: Nose normal.  Mouth/Throat: Oropharynx is clear and moist and mucous membranes are normal.  Eyes: Conjunctivae and EOM are normal. Pupils are equal, round, and reactive to light.  Neck: Normal range of motion. Neck supple.  Cardiovascular: Regular rhythm, S1 normal and S2 normal.  Exam reveals no gallop and no friction rub.   No murmur heard. Pulmonary/Chest: Effort normal and breath sounds normal. No respiratory distress. She exhibits no tenderness.  Abdominal: Soft. Normal appearance and bowel sounds are normal. There is no hepatosplenomegaly. There is no tenderness. There is no rebound, no guarding, no tenderness at McBurney's point and negative Murphy's sign. No hernia.  Musculoskeletal: Normal range of motion.  Neurological: She is alert and oriented to person, place, and time. She has normal strength. No cranial nerve deficit or sensory deficit. Coordination normal. GCS eye subscore is 4. GCS verbal subscore is 5. GCS motor subscore is 6.  Skin: Skin is warm, dry and intact. No rash noted. No cyanosis.  Psychiatric: She has a normal mood and affect. Her speech is normal and behavior is normal. Thought content normal.    ED Course  Procedures (including critical care time) Labs Reviewed  COMPREHENSIVE METABOLIC PANEL - Abnormal; Notable for the following:    Sodium 133 (*)    Creatinine, Ser 0.48 (*)    Total Protein 8.4 (*)    All other components within normal limits  CBC WITH DIFFERENTIAL   Diagnosis: Postoperative seroma   MDM  Patient presents with drainage from the trocar site at the umbilicus. Drainage is bloody and clear, nonpurulent. Patient  has no surrounding erythema. No fever. This is most consistent with seroma. We'll empirically cover with antibiotics, but does not require any further intervention. Followup with surgeon as previously scheduled.  Gilda Crease, MD 08/02/12 0120

## 2012-08-02 NOTE — ED Notes (Signed)
S/P cholecystomy surgical incision from last week with serosanguinous  color drainage, site without any redness or swelling. Pt stated some pain at the site.

## 2012-08-10 ENCOUNTER — Encounter (INDEPENDENT_AMBULATORY_CARE_PROVIDER_SITE_OTHER): Payer: Self-pay

## 2012-08-10 ENCOUNTER — Ambulatory Visit (INDEPENDENT_AMBULATORY_CARE_PROVIDER_SITE_OTHER): Payer: Self-pay | Admitting: Internal Medicine

## 2012-08-10 VITALS — BP 106/78 | HR 68 | Temp 98.0°F | Resp 16 | Ht 63.0 in | Wt 165.4 lb

## 2012-08-10 DIAGNOSIS — K8000 Calculus of gallbladder with acute cholecystitis without obstruction: Secondary | ICD-10-CM | POA: Insufficient documentation

## 2012-08-10 NOTE — Patient Instructions (Signed)
May resume regular activity without restrictions. Follow up as needed. Call with questions or concerns.  

## 2012-08-10 NOTE — Progress Notes (Signed)
  Subjective: Pt returns to the clinic today after undergoing laparoscopic cholecystectomy on 07/28/12 by Dr. Corliss Skains.  The patient is tolerating their diet well and is having no severe pain.  Bowel function is good.  No problems with the wounds.  She is having some intermittent chest discomfort that goes away if she goes outside and walks.  It sounds like it is stress related as this happened before surgery if she would get made.  It is not associated with any other symptoms.  Objective: Vital signs in last 24 hours: Reviewed  PE: Abd: soft, non-tender, +bs, incisions well healed Heart: RRR Lungs: CTA bil   Lab Results:  No results found for this basename: WBC, HGB, HCT, PLT,  in the last 72 hours BMET No results found for this basename: NA, K, CL, CO2, GLUCOSE, BUN, CREATININE, CALCIUM,  in the last 72 hours PT/INR No results found for this basename: LABPROT, INR,  in the last 72 hours CMP     Component Value Date/Time   NA 133* 08/01/2012 2250   K 4.2 08/01/2012 2250   CL 98 08/01/2012 2250   CO2 26 08/01/2012 2250   GLUCOSE 98 08/01/2012 2250   BUN 6 08/01/2012 2250   CREATININE 0.48* 08/01/2012 2250   CALCIUM 9.0 08/01/2012 2250   PROT 8.4* 08/01/2012 2250   ALBUMIN 3.5 08/01/2012 2250   AST 32 08/01/2012 2250   ALT 35 08/01/2012 2250   ALKPHOS 91 08/01/2012 2250   BILITOT 0.3 08/01/2012 2250   GFRNONAA >90 08/01/2012 2250   GFRAA >90 08/01/2012 2250   Lipase     Component Value Date/Time   LIPASE 32 07/27/2012 1744       Studies/Results: No results found.  Anti-infectives: Anti-infectives   None       Assessment/Plan  1.  S/P Laparoscopic Cholecystectomy: doing well, may resume regular activity without restrictions, Pt will follow up with [place] PRN and knows to call with questions or concerns. In regards to the chest discomfort I recommend that she see an urgent care or primary care md if this becomes more persistent or does not resolve with walking.    WHITE, ELIZABETH 08/10/2012

## 2012-08-11 ENCOUNTER — Inpatient Hospital Stay: Admit: 2012-08-11 | Payer: Self-pay

## 2012-09-03 ENCOUNTER — Telehealth (HOSPITAL_COMMUNITY): Payer: Self-pay | Admitting: *Deleted

## 2012-09-03 NOTE — Telephone Encounter (Signed)
Telephoned patient with interpreter Julie Sowell and no answer. 

## 2012-09-16 ENCOUNTER — Encounter (HOSPITAL_COMMUNITY): Payer: Self-pay | Admitting: *Deleted

## 2012-09-16 DIAGNOSIS — M542 Cervicalgia: Secondary | ICD-10-CM | POA: Insufficient documentation

## 2012-09-16 DIAGNOSIS — R109 Unspecified abdominal pain: Secondary | ICD-10-CM | POA: Insufficient documentation

## 2012-09-16 DIAGNOSIS — R509 Fever, unspecified: Secondary | ICD-10-CM | POA: Insufficient documentation

## 2012-09-16 DIAGNOSIS — N390 Urinary tract infection, site not specified: Secondary | ICD-10-CM | POA: Insufficient documentation

## 2012-09-16 DIAGNOSIS — M79609 Pain in unspecified limb: Secondary | ICD-10-CM | POA: Insufficient documentation

## 2012-09-16 DIAGNOSIS — R300 Dysuria: Secondary | ICD-10-CM | POA: Insufficient documentation

## 2012-09-16 DIAGNOSIS — IMO0001 Reserved for inherently not codable concepts without codable children: Secondary | ICD-10-CM | POA: Insufficient documentation

## 2012-09-16 NOTE — ED Notes (Signed)
Pt in c/o bilateral hand pain and lower back, also pain with urination, states symptoms x1 week, unsure of fever, none during triage

## 2012-09-17 ENCOUNTER — Emergency Department (HOSPITAL_COMMUNITY)
Admission: EM | Admit: 2012-09-17 | Discharge: 2012-09-17 | Disposition: A | Discharge status: Home / Self Care | Payer: Self-pay | Attending: Emergency Medicine | Admitting: Emergency Medicine

## 2012-09-17 DIAGNOSIS — N390 Urinary tract infection, site not specified: Secondary | ICD-10-CM

## 2012-09-17 DIAGNOSIS — M7918 Myalgia, other site: Secondary | ICD-10-CM

## 2012-09-17 LAB — URINALYSIS, ROUTINE W REFLEX MICROSCOPIC
Bilirubin Urine: NEGATIVE
Hgb urine dipstick: NEGATIVE
Protein, ur: NEGATIVE mg/dL
Urobilinogen, UA: 0.2 mg/dL (ref 0.0–1.0)

## 2012-09-17 LAB — URINE MICROSCOPIC-ADD ON

## 2012-09-17 MED ORDER — OXYCODONE-ACETAMINOPHEN 5-325 MG PO TABS
1.0000 | ORAL_TABLET | Freq: Once | ORAL | Status: AC
  Administered 2012-09-17: 1 via ORAL
  Filled 2012-09-17: qty 1

## 2012-09-17 MED ORDER — OXYCODONE-ACETAMINOPHEN 5-325 MG PO TABS: 1.0000 | ORAL_TABLET | Freq: Four times a day (QID) | ORAL | Status: DC | PRN

## 2012-09-17 MED ORDER — CYCLOBENZAPRINE HCL 5 MG PO TABS: 5.0000 mg | ORAL_TABLET | Freq: Three times a day (TID) | ORAL | Status: DC | PRN

## 2012-09-17 MED ORDER — IBUPROFEN 800 MG PO TABS: 800.0000 mg | ORAL_TABLET | Freq: Three times a day (TID) | ORAL | Status: DC

## 2012-09-17 MED ORDER — CEPHALEXIN 500 MG PO CAPS: 500.0000 mg | ORAL_CAPSULE | Freq: Four times a day (QID) | ORAL | Status: DC

## 2012-09-17 MED ORDER — CEPHALEXIN 250 MG PO CAPS
500.0000 mg | ORAL_CAPSULE | Freq: Once | ORAL | Status: AC
  Administered 2012-09-17: 500 mg via ORAL
  Filled 2012-09-17: qty 2

## 2012-09-17 MED ORDER — IBUPROFEN 800 MG PO TABS
800.0000 mg | ORAL_TABLET | Freq: Once | ORAL | Status: AC
  Administered 2012-09-17: 800 mg via ORAL
  Filled 2012-09-17: qty 1

## 2012-09-17 NOTE — ED Provider Notes (Signed)
CSN: [id number]     Arrival date & time 09/16/12  2054 History     First MD Initiated Contact with Patient 09/17/12 0114     Chief Complaint  Patient presents with  . Back Pain  . Arm pain    (Consider location/radiation/quality/duration/timing/severity/associated sxs/prior Treatment) HPI Pt presents with lower right sided back pain as well as some neck and upper back soreness over the past one week.  She states she tried advil without much relief.  Movement and palpation makes the symptoms worse.  No weakness of extremities, no urinary retention, no incontinence of bowel or bladder. She also c/o dysuria associated with right flank pain.  No urgency or frequency.  No fever.  No injury.  She denies hx of prior UTI or recent antibiotics. There are no other associated systemic symptoms, there are no other alleviating or modifying factors.   Past Medical History  Diagnosis Date  . No pertinent past medical history    Past Surgical History  Procedure Laterality Date  . Tubal ligation      7 years ago  . Cholecystectomy N/A 07/28/2012    Procedure: LAPAROSCOPIC CHOLECYSTECTOMY WITH INTRAOPERATIVE CHOLANGIOGRAM;  Surgeon: Wilmon Arms. Corliss Skains, MD;  Location: [facility] OR;  Service: General;  Laterality: N/A;   Family History  Problem Relation Age of Onset  . Hypertension Mother   . Diabetes Mother   . Heart disease Father   . Anesthesia problems Neg Hx    History  Substance Use Topics  . Smoking status: Never Smoker   . Smokeless tobacco: Never Used  . Alcohol Use: No   OB History   Grav Para Term Preterm Abortions TAB SAB Ect Mult Living   4 4 4      1 5      Review of Systems ROS reviewed and all otherwise negative except for mentioned in HPI  Allergies  Review of patient's allergies indicates no known allergies.  Home Medications   Current Outpatient Rx  Name  Route  Sig  Dispense  Refill  . cephALEXin (KEFLEX) 500 MG capsule   Oral   Take 1 capsule (500 mg total) by mouth 4  (four) times daily.   20 capsule   0   . cyclobenzaprine (FLEXERIL) 5 MG tablet   Oral   Take 1 tablet (5 mg total) by mouth 3 (three) times daily as needed for muscle spasms.   20 tablet   0   . ibuprofen (ADVIL,MOTRIN) 800 MG tablet   Oral   Take 1 tablet (800 mg total) by mouth 3 (three) times daily.   21 tablet   0   . oxyCODONE-acetaminophen (PERCOCET/ROXICET) 5-325 MG per tablet   Oral   Take 1-2 tablets by mouth every 6 (six) hours as needed for pain.   15 tablet   0    BP 110/92  Pulse 52  Temp(Src) 98.6 F (37 C) (Oral)  Resp 14  Wt 165 lb (74.844 kg)  BMI 29.24 kg/m2  SpO2 100% Vitals reviewed Physical Exam Physical Examination: General appearance - alert, well appearing, and in no distress Mental status - alert, oriented to person, place, and time Eyes - no scleral icterus, no conjunctival injection Mouth - mucous membranes moist, pharynx normal without lesions Chest - clear to auscultation, no wheezes, rales or rhonchi, symmetric air entry Heart - normal rate, regular rhythm, normal S1, S2, no murmurs, rubs, clicks or gallops Abdomen - soft, nontender, nondistended, no masses or organomegaly Back exam - +  right CVA tenderness, also ttp over right trapezius distribution, full range of motion, no midline tenderness, palpable spasm or pain on motion Extremities - peripheral pulses normal, no pedal edema, no clubbing or cyanosis Skin - normal coloration and turgor, no rashes  ED Course   Procedures (including critical care time)  Labs Reviewed  URINALYSIS, ROUTINE W REFLEX MICROSCOPIC - Abnormal; Notable for the following:    APPearance CLOUDY (*)    Leukocytes, UA LARGE (*)    All other components within normal limits  URINE MICROSCOPIC-ADD ON - Abnormal; Notable for the following:    Squamous Epithelial / LPF MANY (*)    Bacteria, UA MANY (*)    All other components within normal limits  URINE CULTURE   No results found. 1. Urinary tract infection    2. Musculoskeletal pain     MDM  Pt presenting with one week of lower back pain- right sided as well as neck soreness.  She also has dysuria and subjective fever.  UA c/w UTI also with contamination, however given her symptoms suspect UTI to be real.  Will send urine culture.  Pt started on keflex.  Neck and back pain is not in midline, no signs or symptoms of cauda equina, low suspicion for epidural abscess.  Discharged with strict return precautions.  Pt agreeable with plan.  Ethelda Chick, MD 09/17/12 [phone]

## 2012-09-18 LAB — URINE CULTURE
Colony Count: NO GROWTH
Culture: NO GROWTH

## 2012-09-21 ENCOUNTER — Ambulatory Visit (HOSPITAL_COMMUNITY)
Admission: RE | Admit: 2012-09-21 | Discharge: 2012-09-21 | Disposition: A | Discharge status: Home / Self Care | Payer: Self-pay | Source: Ambulatory Visit | Attending: Obstetrics and Gynecology | Admitting: Obstetrics and Gynecology

## 2012-09-21 ENCOUNTER — Encounter (HOSPITAL_COMMUNITY): Payer: Self-pay

## 2012-09-21 VITALS — BP 118/80 | Temp 97.7°F | Ht 63.25 in | Wt 169.6 lb

## 2012-09-21 DIAGNOSIS — Z1239 Encounter for other screening for malignant neoplasm of breast: Secondary | ICD-10-CM

## 2012-09-21 DIAGNOSIS — N6311 Unspecified lump in the right breast, upper outer quadrant: Secondary | ICD-10-CM | POA: Insufficient documentation

## 2012-09-21 HISTORY — DX: Depression, unspecified: F32.A

## 2012-09-21 HISTORY — DX: Major depressive disorder, single episode, unspecified: F32.9

## 2012-09-21 NOTE — Progress Notes (Signed)
Complaints of left breast pain at times that patient rates at a 5-6 out of 10. After last mammogram 01/22/2011 patient was recommended to have a 6 month follow up diagnostic mammogram of the right breast. Patient has not had a mammogram since 01/22/2011.  Pap Smear:    Pap smear not completed today. Last Pap smear was 10/16/2011 at the [facility] and normal. Per patient no history of an abnormal Pap smear. Last Pap smear result is in EPIC.  Physical exam: Breasts Breasts symmetrical. No skin abnormalities bilateral breasts. No nipple retraction bilateral breasts. No nipple discharge bilateral breasts. No lymphadenopathy. No lumps palpated left breast. Palpated a small pea sized lump within the right breast at 10 o'clock 3 cm from the nipple. Patient complained of tenderness when palpated left outer breast. Referred patient to the [facility] for diagnostic mammogram and possible bilateral breast ultrasounds. Appointment scheduled for Thursday, October 07, 2012 at 1430.       Pelvic/Bimanual No Pap smear completed today since last Pap smear was 10/16/2011. Pap smear not indicated per BCCCP guidelines.

## 2012-09-21 NOTE — Patient Instructions (Addendum)
[name] how to perform BSE and gave educational materials to take home. Patient did not need a Pap smear today due to last Pap smear was 10/16/2011. Let her know BCCCP will cover Pap smears every 3 years unless has a history of abnormal Pap smears. Referred patient to the [facility] for diagnostic mammogram and possible bilateral breast ultrasounds. Appointment scheduled for Thursday, October 07, 2012 at 1430. Patient aware of appointment and will be there.  [name] verbalized understanding.  [name], [name], RN 1:17 PM

## 2012-10-07 ENCOUNTER — Ambulatory Visit
Admission: RE | Admit: 2012-10-07 | Discharge: 2012-10-07 | Disposition: A | Discharge status: Home / Self Care | Payer: No Typology Code available for payment source | Source: Ambulatory Visit | Attending: Obstetrics and Gynecology | Admitting: Obstetrics and Gynecology

## 2012-10-07 DIAGNOSIS — N63 Unspecified lump in unspecified breast: Secondary | ICD-10-CM

## 2012-11-25 ENCOUNTER — Emergency Department (HOSPITAL_COMMUNITY)
Admission: EM | Admit: 2012-11-25 | Discharge: 2012-11-25 | Disposition: A | Discharge status: Home / Self Care | Payer: Self-pay | Attending: Emergency Medicine | Admitting: Emergency Medicine

## 2012-11-25 ENCOUNTER — Encounter (HOSPITAL_COMMUNITY): Payer: Self-pay | Admitting: Emergency Medicine

## 2012-11-25 DIAGNOSIS — Z791 Long term (current) use of non-steroidal anti-inflammatories (NSAID): Secondary | ICD-10-CM | POA: Insufficient documentation

## 2012-11-25 DIAGNOSIS — M5416 Radiculopathy, lumbar region: Secondary | ICD-10-CM

## 2012-11-25 DIAGNOSIS — Z79899 Other long term (current) drug therapy: Secondary | ICD-10-CM | POA: Insufficient documentation

## 2012-11-25 DIAGNOSIS — IMO0002 Reserved for concepts with insufficient information to code with codable children: Secondary | ICD-10-CM | POA: Insufficient documentation

## 2012-11-25 DIAGNOSIS — M79609 Pain in unspecified limb: Secondary | ICD-10-CM | POA: Insufficient documentation

## 2012-11-25 MED ORDER — HYDROCODONE-ACETAMINOPHEN 5-325 MG PO TABS: ORAL_TABLET | ORAL | Status: DC

## 2012-11-25 NOTE — ED Provider Notes (Signed)
CSN: [id number]     Arrival date & time 11/25/12  4098 History   First MD Initiated Contact with Patient 11/25/12 [phone]     Chief Complaint  Patient presents with  . Back Pain  . Leg Pain   (Consider location/radiation/quality/duration/timing/severity/associated sxs/prior Treatment) HPI  Crystal [name] is a 42 y.o. female complaining of low back pain rating bleeding down the posterior of her left leg past her knee. Patient has had this pain for 6 years, she rates it a 6/10, is exacerbated by lying flat. Normally the pain is relieved by ibuprofen however in the last few days it is more severe. Patient denies any fever, change in bowel or bladder habits, numbness, weakness, difficulty walking, saddle anesthesia, history of IV drug use, history of cancer.  Past Medical History  Diagnosis Date  . No pertinent past medical history   . Depression    Past Surgical History  Procedure Laterality Date  . Tubal ligation      7 years ago  . Cholecystectomy N/A 07/28/2012    Procedure: LAPAROSCOPIC CHOLECYSTECTOMY WITH INTRAOPERATIVE CHOLANGIOGRAM;  Surgeon: Wilmon Arms. Corliss Skains, MD;  Location: [facility] OR;  Service: General;  Laterality: N/A;   Family History  Problem Relation Age of Onset  . Hypertension Mother   . Diabetes Mother   . Heart disease Father   . Anesthesia problems Neg Hx    History  Substance Use Topics  . Smoking status: Never Smoker   . Smokeless tobacco: Never Used  . Alcohol Use: No   OB History   Grav Para Term Preterm Abortions TAB SAB Ect Mult Living   4 4 4      1 5      Review of Systems 10 systems reviewed and found to be negative, except as noted in the HPI   Allergies  Review of patient's allergies indicates no known allergies.  Home Medications   Current Outpatient Rx  Name  Route  Sig  Dispense  Refill  . cephALEXin (KEFLEX) 500 MG capsule   Oral   Take 1 capsule (500 mg total) by mouth 4 (four) times daily.   20 capsule   0   . cyclobenzaprine  (FLEXERIL) 5 MG tablet   Oral   Take 1 tablet (5 mg total) by mouth 3 (three) times daily as needed for muscle spasms.   20 tablet   0   . HYDROcodone-acetaminophen (NORCO/VICODIN) 5-325 MG per tablet      Take 1-2 tablets by mouth every 6 hours as needed for pain.   15 tablet   0   . ibuprofen (ADVIL,MOTRIN) 800 MG tablet   Oral   Take 1 tablet (800 mg total) by mouth 3 (three) times daily.   21 tablet   0   . oxyCODONE-acetaminophen (PERCOCET/ROXICET) 5-325 MG per tablet   Oral   Take 1-2 tablets by mouth every 6 (six) hours as needed for pain.   15 tablet   0    BP 122/80  Pulse 71  Temp(Src) 98.3 F (36.8 C) (Oral)  Resp 16  SpO2 98%  LMP 10/06/2012 Physical Exam  Nursing note and vitals reviewed. Constitutional: She is oriented to person, place, and time. She appears well-developed and well-nourished. No distress.  HENT:  Head: Normocephalic.  Eyes: Conjunctivae and EOM are normal.  Cardiovascular: Normal rate.   Pulmonary/Chest: Effort normal. No stridor.  Musculoskeletal: Normal range of motion.  No point tenderness to percussion of lumbar spinal processes.  No TTP or paraspinal  spasm. Strength is 5 out of 5 to bilateral lower extremities at hip and knee;extensor hallucis longus 5 out of 5. Ankle strength 5 out of 5, no clonus, neurovascularly intact.   Straight leg raise is positive on the ipsilateral side at approximately 60, negative on the contralateral side. No saddle anesthesia.  Neurological: She is alert and oriented to person, place, and time.  Psychiatric: She has a normal mood and affect.    ED Course  Procedures (including critical care time) Labs Review Labs Reviewed - No data to display Imaging Review No results found.  EKG Interpretation   None       MDM   1. Lumbar radicular pain      Filed Vitals:   11/25/12 0804  BP: 122/80  Pulse: 71  Temp: 98.3 F (36.8 C)  TempSrc: Oral  Resp: 16  SpO2: 98%     [name] is a 42 y.o. female Patient with back pain.  No neurological deficits and normal neuro exam.  Patient can walk.  No loss of bowel or bladder control.  No concern for cauda equina.  No fever, night sweats, weight loss, h/o cancer, IVDU.  RICE protocol and pain medicine indicated and discussed with patient.   Pt is hemodynamically stable, appropriate for, and amenable to discharge at this time. Pt verbalized understanding and agrees with care plan. All questions answered. Outpatient follow-up and specific return precautions discussed.    New Prescriptions   HYDROCODONE-ACETAMINOPHEN (NORCO/VICODIN) 5-325 MG PER TABLET    Take 1-2 tablets by mouth every 6 hours as needed for pain.    Note: Portions of this report may have been transcribed using voice recognition software. Every effort was made to ensure accuracy; however, inadvertent computerized transcription errors may be present      Wynetta Emery, PA-C 11/25/12 [phone]

## 2012-11-25 NOTE — ED Provider Notes (Signed)
Medical screening examination/treatment/procedure(s) were performed by non-physician practitioner and as supervising physician I was immediately available for consultation/collaboration.  EKG Interpretation   None        Sabine Tenenbaum, MD 11/25/12 1011 

## 2012-11-25 NOTE — ED Notes (Signed)
Pt ambulatory to discharge, pt family member translating. Verbalized understanding.

## 2012-11-25 NOTE — ED Notes (Signed)
Has had chronic low back pain x 6 yrs. C/o worsening pain radiating down LLE 4 x days ago. OTC ibuprofen not helping. Pt ambulatory from triage. Seen in ED many times in past for same. Son at bedside translating

## 2012-11-25 NOTE — ED Notes (Signed)
Pt with hx of lower back pain that radiates to L leg x 6 years.  She came in this am b/c the pain is getting worse.

## 2012-12-02 ENCOUNTER — Other Ambulatory Visit: Payer: Self-pay

## 2013-03-15 ENCOUNTER — Emergency Department (HOSPITAL_COMMUNITY)
Admission: EM | Admit: 2013-03-15 | Discharge: 2013-03-16 | Disposition: A | Discharge status: Home / Self Care | Payer: Self-pay | Attending: Emergency Medicine | Admitting: Emergency Medicine

## 2013-03-15 ENCOUNTER — Encounter (HOSPITAL_COMMUNITY): Payer: Self-pay | Admitting: Emergency Medicine

## 2013-03-15 DIAGNOSIS — M545 Low back pain, unspecified: Secondary | ICD-10-CM | POA: Insufficient documentation

## 2013-03-15 DIAGNOSIS — Z8659 Personal history of other mental and behavioral disorders: Secondary | ICD-10-CM | POA: Insufficient documentation

## 2013-03-15 DIAGNOSIS — M5380 Other specified dorsopathies, site unspecified: Secondary | ICD-10-CM | POA: Insufficient documentation

## 2013-03-15 NOTE — ED Notes (Signed)
Pt states lower back pain for the past three days. Pt states the pain radiates down her left leg. Pt denies problems with urination. Pt states tingling sensation.

## 2013-03-16 MED ORDER — KETOROLAC TROMETHAMINE 30 MG/ML IJ SOLN
60.0000 mg | Freq: Once | INTRAMUSCULAR | Status: AC
  Administered 2013-03-16: 60 mg via INTRAMUSCULAR
  Filled 2013-03-16 (×2): qty 2

## 2013-03-16 MED ORDER — OXYCODONE-ACETAMINOPHEN 5-325 MG PO TABS
1.0000 | ORAL_TABLET | Freq: Once | ORAL | Status: AC
  Administered 2013-03-16: 1 via ORAL
  Filled 2013-03-16: qty 1

## 2013-03-16 MED ORDER — HYDROCODONE-ACETAMINOPHEN 5-325 MG PO TABS: 1.0000 | ORAL_TABLET | ORAL | Status: DC | PRN

## 2013-03-16 NOTE — ED Provider Notes (Signed)
Medical screening examination/treatment/procedure(s) were performed by non-physician practitioner and as supervising physician I was immediately available for consultation/collaboration.    Seini Lannom M Veta Dambrosia, MD 03/16/13 0408 

## 2013-03-16 NOTE — ED Provider Notes (Signed)
CSN: [id number]     Arrival date & time 03/15/13  2339 History   First MD Initiated Contact with Patient 03/15/13 2353     Chief Complaint  Patient presents with  . Back Pain  . Sciatica     (Consider location/radiation/quality/duration/timing/severity/associated sxs/prior Treatment) HPI  Patient to the ER with complaints of left sided back pain that radiates towards her hip and then down towards the back part of her knee. She has had problems with back pain for the past 6 years and intermittently has exacerbations of her pain. She denies having any injuries recently, denies doing anything to illicit the pain. She says that the pain is sharp and worsens with movement. It gets somewhat better with rest. She has tried Ibuprofen at home. Denies rash, redness, or induration to the area.   Past Medical History  Diagnosis Date  . No pertinent past medical history   . Depression    Past Surgical History  Procedure Laterality Date  . Tubal ligation      7 years ago  . Cholecystectomy N/A 07/28/2012    Procedure: LAPAROSCOPIC CHOLECYSTECTOMY WITH INTRAOPERATIVE CHOLANGIOGRAM;  Surgeon: Imogene Burn. Georgette Dover, MD;  Location: [facility] OR;  Service: General;  Laterality: N/A;   Family History  Problem Relation Age of Onset  . Hypertension Mother   . Diabetes Mother   . Heart disease Father   . Anesthesia problems Neg Hx    History  Substance Use Topics  . Smoking status: Never Smoker   . Smokeless tobacco: Never Used  . Alcohol Use: No   OB History   Grav Para Term Preterm Abortions TAB SAB Ect Mult Living   4 4 4      1 5      Review of Systems  The patient denies anorexia, fever, weight loss,, vision loss, decreased hearing, hoarseness, chest pain, syncope, dyspnea on exertion, peripheral edema, balance deficits, hemoptysis, abdominal pain, melena, hematochezia, severe indigestion/heartburn, hematuria, incontinence, genital sores, muscle weakness, suspicious skin lesions, transient blindness,  depression, unusual weight change, abnormal bleeding, enlarged lymph nodes, angioedema, and breast masses.   Allergies  Review of patient's allergies indicates no known allergies.  Home Medications   Current Outpatient Rx  Name  Route  Sig  Dispense  Refill  . ibuprofen (ADVIL,MOTRIN) 200 MG tablet   Oral   Take 200 mg by mouth every 6 (six) hours as needed for moderate pain.         Marland Kitchen HYDROcodone-acetaminophen (NORCO/VICODIN) 5-325 MG per tablet   Oral   Take 1-2 tablets by mouth every 4 (four) hours as needed.   20 tablet   0    BP 112/73  Pulse 64  Temp(Src) 97.9 F (36.6 C) (Oral)  Resp 16  Ht 5\' 7"  (1.702 m)  Wt 168 lb (76.204 kg)  BMI 26.31 kg/m2  SpO2 100% Physical Exam  Nursing note and vitals reviewed. Constitutional: She appears well-developed and well-nourished. No distress.  HENT:  Head: Normocephalic and atraumatic.  Eyes: Pupils are equal, round, and reactive to light.  Neck: Normal range of motion. Neck supple.  Cardiovascular: Normal rate and regular rhythm.   Pulmonary/Chest: Effort normal.  Abdominal: Soft.  Musculoskeletal:       Lumbar back: She exhibits pain and spasm.       Back:   Equal strength to bilateral lower extremities. Neurosensory function adequate to both legs. Skin color is normal. Skin is warm and moist. I see no step off deformity, no bony tenderness.  Pt is able to ambulate without limp. Pain is relieved when sitting in certain positions. ROM is decreased due to pain. No crepitus, laceration, effusion, swelling.  Pulses are normal   Neurological: She is alert.  Skin: Skin is warm and dry.    ED Course  Procedures (including critical care time) Labs Review Labs Reviewed - No data to display Imaging Review No results found.  EKG Interpretation   None       MDM   Final diagnoses:  Low back pain    Patient given a shot of Toradol 60mg  IM and 1 percocet in the ED. She was monitored for pain and had significant  relief.  43 y.o.Crystal [name]'s  with back pain. No neurological deficits and normal neuro exam. Patient can walk but states is painful. No loss of bowel or bladder control. No concern for cauda equina. No fever, night sweats, weight loss, h/o cancer, IVDU. RICE protocol and pain medicine indicated and discussed with patient.   Patient Plan 1. Medications: narcotic pain medication, muscle relaxer and usual home medications  2. Treatment: rest, drink plenty of fluids, gentle stretching as discussed, alternate ice and heat  3. Follow Up: Please followup with your primary doctor for discussion of your diagnoses and further evaluation after today's visit; if you do not have a primary care doctor use the resource guide provided to find one   Vital signs are stable at discharge. Filed Vitals:   03/15/13 2345  BP: 112/73  Pulse: 64  Temp: 97.9 F (36.6 C)  Resp: 16    Patient/guardian has voiced understanding and agreed to follow-up with the PCP or specialist.        Linus Mako, PA-C 03/16/13 [phone]

## 2013-03-16 NOTE — Discharge Instructions (Signed)
Dolor de espalda en el adulto  (Back Pain, Adult)   El dolor de cintura es frecuente. Aproximadamente 1 de cada 5 personas lo sufren. La causa rara vez pone en peligro la vida. Con frecuencia mejora luego de algún tiempo. Alrededor de la mitad de las personas que sufren un inicio súbito de dolor de cintura, se sentirán mejor luego de 2 semanas. Aproximadamente 8 de cada 10 se sentirán mejor luego de 6 semanas.   CAUSAS   Algunas causas comunes son:   · Distensión de los músculos o ligamentos que sostienen la columna vertebral.  · Desgaste (degeneración) de los discos vertebrales.  · Artritis.  · Traumatismos directos en la espalda.  DIAGNÓSTICO   La mayor parte de las veces, la causa directa no se conoce. Sin embargo, el dolor puede tratarse efectivamente aún cuando no se conozca la causa. Una de las formas más precisas de asegurar que la causa del dolor no constituye un peligro es responder a las preguntas del médico acerca de su salud y sus síntomas. Si el médico necesita más información, podrá indicar análisis de laboratorio o realizar un diagnóstico por imágenes (radiografías o resonancia magnética). Sin embargo, aunque las imágenes muestren modificaciones, generalmente no es necesaria la cirugía.   INSTRUCCIONES PARA EL CUIDADO EN EL HOGAR   En algunas personas, el dolor de espalda vuelve. Como rara vez es peligroso, los pacientes pueden aprender a manejarlo ellos mismos.   · Manténgase activo. Si permanece sentado o de pie mucho tiempo en el mismo lugar, se tensiona la espalda.  No se siente, maneje ni se quede parado en un mismo lugar por más de 30 minutos. Realice caminatas cortas en superficies planas ni bien el dolor haya cedido. Trate de aumentar cada día el tiempo que camina .  · No se quede en la cama. Si hace reposo durante más de 1 o 2 días, puede retrasar la recuperación.  · No evite los ejercicios ni el trabajo. El cuerpo está hecho para moverse. No es peligroso estar activo, aunque le duela la  espalda. La espalda se curará más rápido si continúa sus actividades antes de que el dolor se vaya.  · Preste atención a su cuerpo cuando se incline y se levante. Muchas personas sienten menos molestias cuando levantan objetos si doblan las rodillas, mantienen la carga cerca del cuerpo y evitan torcerse. Generalmente, las posiciones más cómodas son las que ejercen menos tensión en la espalda en recuperación.  · Encuentre una posición cómoda para dormir. Utilice un colchón firme y recuéstese de costado. Doble ligeramente sus rodillas. Si se recuesta sobre su espalda, coloque una almohada debajo de sus rodillas.  · Tome sólo medicamentos de venta libre o recetados, según las indicaciones del médico. Los medicamentos de venta libre para calmar el dolor y reducir la inflamación, son los que en general más ayudan. El médico podrá prescribirle relajantes musculares. Estos medicamentos calman el dolor de modo que pueda retornar a sus actividades normales y a realizar ejercicios saludables.  · Aplique hielo sobre la zona lesionada.  · Ponga el hielo en una bolsa plástica.  · Colóquese una toalla entre la piel y la bolsa de hielo.  · Deje la bolsa de hielo durante 15 a 20 minutos 3 a 4 veces por día, durante los primeros 2 ó 3 días. Luego podrá alternar entre calor y hielo para reducir el dolor y los espasmos.  · Consulte a su médico si puede tratar de hacer ejercicios para la espalda y recibir un masaje suave. Pueden ser beneficiosos.  · Evite sentirse ansioso o   estresado. El estrés aumenta la tensión muscular y puede empeorar el dolor de espalda. Es importante reconocer cuando está ansioso o estresado y aprender la forma de controlarlos. El ejercicio es una gran opción.  SOLICITE ATENCIÓN MÉDICA SI:   · Siente un dolor que no se alivia con reposo o medicamentos.  · El dolor no mejora en 1 semana.  · Desarrolla nuevos síntomas.  · No se siente bien en general.  SOLICITE ATENCIÓN MÉDICA DE INMEDIATO SI:  · Siente un dolor  que se irradia desde la espalda hacia sus piernas.  · Desarrolla nuevos problemas en el intestino o la vejiga.  · Siente debilidad o adormecimiento inusual en sus brazos o piernas.  · Presenta náuseas o vómitos.  · Presenta dolor abdominal.  · Se siente desfalleciente.  Document Released: 01/13/2005 Document Revised: 07/15/2011  [employer]® Patient Information ©2014 [employer].

## 2013-03-28 ENCOUNTER — Ambulatory Visit: Payer: Self-pay | Admitting: Family Medicine

## 2013-03-28 VITALS — BP 104/72 | HR 86 | Temp 98.0°F | Resp 16 | Ht 63.0 in | Wt 167.0 lb

## 2013-03-28 DIAGNOSIS — M791 Myalgia, unspecified site: Secondary | ICD-10-CM

## 2013-03-28 DIAGNOSIS — IMO0001 Reserved for inherently not codable concepts without codable children: Secondary | ICD-10-CM

## 2013-03-28 DIAGNOSIS — M542 Cervicalgia: Secondary | ICD-10-CM

## 2013-03-28 MED ORDER — METHOCARBAMOL 500 MG PO TABS: ORAL_TABLET | ORAL | Status: DC

## 2013-03-28 MED ORDER — NAPROXEN 500 MG PO TABS: 500.0000 mg | ORAL_TABLET | Freq: Two times a day (BID) | ORAL | Status: DC

## 2013-03-28 MED ORDER — TRAMADOL HCL 50 MG PO TABS: ORAL_TABLET | ORAL | Status: DC

## 2013-03-28 NOTE — Patient Instructions (Addendum)
Take methocarbamol one pill in the morning, one in the afternoon, and 2 at bedtime for muscle relaxants  Take naproxen 500 mg one at breakfast and one at supper for pain and inflammation  Take tramadol only if needed for severe pain  Avoid heavy lifting and straining until doing better  Take over-the-counter diphenhydramine (benadryl) one or two pills if needed for sleep  Return if not improving over the next 10 days

## 2013-03-28 NOTE — Progress Notes (Signed)
Subjective:  43 year old lady who is here with neck pain and body aches for the last 4 days or so. She has been hurting in the back of her neck and down the upper part of her back. Some low back pain. Her arms ache. No ascitic injury. Her son interpreted for her, she is Spanish-speaking. 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 Not much physical labor. No allergies and no history of seizure disorder. She is having trouble sleeping.  Objective: Her neck is tender to touch especially on the right low neck. She's tender between the scapula and down in the low back. Fair range of motion. Deep tender reflexes symmetrical.  Assessment: Cervical and upper back strain and pain Sleep disturbance  Plan: Robaxin, naproxen,, no

## 2013-03-29 ENCOUNTER — Encounter (HOSPITAL_COMMUNITY): Payer: Self-pay | Admitting: Emergency Medicine

## 2013-06-24 ENCOUNTER — Other Ambulatory Visit: Payer: Self-pay

## 2013-06-24 ENCOUNTER — Ambulatory Visit (HOSPITAL_BASED_OUTPATIENT_CLINIC_OR_DEPARTMENT_OTHER): Payer: Self-pay

## 2013-06-24 VITALS — BP 114/72 | HR 60 | Temp 97.9°F | Resp 12 | Ht 62.5 in | Wt 166.4 lb

## 2013-06-24 DIAGNOSIS — Z0000 Encounter for general adult medical examination without abnormal findings: Secondary | ICD-10-CM

## 2013-06-24 LAB — LIPID PANEL
CHOLESTEROL: 153 mg/dL (ref 0–200)
HDL: 52 mg/dL (ref >39)
LDL Cholesterol: 86 mg/dL (ref 0–99)
TRIGLYCERIDES: 75 mg/dL (ref <150)
Total CHOL/HDL Ratio: 2.9 Ratio
VLDL: 15 mg/dL (ref 0–40)

## 2013-06-24 LAB — HEMOGLOBIN A1C
HEMOGLOBIN A1C: 5.6 % (ref <5.7)
MEAN PLASMA GLUCOSE: 114 mg/dL (ref <117)

## 2013-06-24 LAB — GLUCOSE (CC13): GLUCOSE: 94 mg/dL (ref 70–140)

## 2013-06-24 NOTE — Patient Instructions (Signed)
Discussed health assessment with patient.She will be called with results of lab work and we will then discussed any further follow up the patient needs. Patient verbalized understanding. 

## 2013-06-24 NOTE — Progress Notes (Signed)
Patient is a new patient to [facility] and is currently a [facility] patient effective 09/21/2012 and with interpreter and daughter.  Clinical Measurements: Patient is 5 ft. 2.5  inches, weight 166.4 lbs, waist circumference 33 inches, and hip circumference 45inches.   Medical History: Patient has no history of high cholesterol. Patient does not have a history of hypertension or diabetes. Per patient no diagnosed history of coronary heart disease, heart attack, heart failure, stroke/TIA, vascular disease or congenital heart defects. Per patient has been experiencing Chest Pain lately but has had two recent deaths of family member. Patient stated that feels depressed.  Blood Pressure, Self-measurement: Patient states has no reason to check Blood pressure.  Nutrition Assessment: Patient stated that eats fruits three times a week. Patient states she eats one serving of vegetables a day. Patient states eats 3 or more ounces of whole grains daily. Patient does eat two or more servings of fish weekly.  Patient states she does not drink more than 36 ounces or 450 calories of beverages with added sugars weekly. Patient states she drinks a lot of water. Patient stated she does not watch her salt intake but does not use much.  Physical Activity Assessment: Patient stated she does 620 minutes of moderate exercise a week and  No vigorous activity.  Smoking Status: Patient has never smoked and is not exposed to smoke.   Quality of Life Assessment: In assessing patient's quality of life she stated that out of the past 30 days that she has felt her health was not good for 3 of them. Patient also stated that in the past 30 days that her mental health is not good including stress, depression and problems with emotions for 14 days. Patient did state that out of the past 30 days she felt her physical or mental health had kept her from doing her usual activities including self-care, work or recreation for 2-3  days..   Plan: Lab work will be done today including a lipid panel, blood glucose, and Hgb A1C. Will call lab results when they are finished. Patient will return for Health Coaching. Will try and find grief counseling.

## 2013-06-25 ENCOUNTER — Encounter (HOSPITAL_COMMUNITY): Payer: Self-pay | Admitting: Emergency Medicine

## 2013-06-25 DIAGNOSIS — F329 Major depressive disorder, single episode, unspecified: Secondary | ICD-10-CM | POA: Insufficient documentation

## 2013-06-25 DIAGNOSIS — F3289 Other specified depressive episodes: Secondary | ICD-10-CM | POA: Insufficient documentation

## 2013-06-25 DIAGNOSIS — Z79899 Other long term (current) drug therapy: Secondary | ICD-10-CM | POA: Insufficient documentation

## 2013-06-25 DIAGNOSIS — R109 Unspecified abdominal pain: Secondary | ICD-10-CM | POA: Insufficient documentation

## 2013-06-25 LAB — COMPREHENSIVE METABOLIC PANEL
ALT: 17 U/L (ref 0–35)
AST: 16 U/L (ref 0–37)
Albumin: 3.8 g/dL (ref 3.5–5.2)
Alkaline Phosphatase: 78 U/L (ref 39–117)
BILIRUBIN TOTAL: 0.2 mg/dL — AB (ref 0.3–1.2)
BUN: 15 mg/dL (ref 6–23)
CALCIUM: 9.4 mg/dL (ref 8.4–10.5)
CHLORIDE: 103 meq/L (ref 96–112)
CO2: 24 meq/L (ref 19–32)
CREATININE: 0.7 mg/dL (ref 0.50–1.10)
GLUCOSE: 103 mg/dL — AB (ref 70–99)
Potassium: 3.7 mEq/L (ref 3.7–5.3)
Sodium: 140 mEq/L (ref 137–147)
Total Protein: 8.2 g/dL (ref 6.0–8.3)

## 2013-06-25 LAB — CBC WITH DIFFERENTIAL/PLATELET
BASOS ABS: 0 10*3/uL (ref 0.0–0.1)
Basophils Relative: 0 % (ref 0–1)
EOS PCT: 3 % (ref 0–5)
Eosinophils Absolute: 0.2 10*3/uL (ref 0.0–0.7)
HEMATOCRIT: 35.4 % — AB (ref 36.0–46.0)
HEMOGLOBIN: 11.8 g/dL — AB (ref 12.0–15.0)
LYMPHS ABS: 2.9 10*3/uL (ref 0.7–4.0)
LYMPHS PCT: 42 % (ref 12–46)
MCH: 27.8 pg (ref 26.0–34.0)
MCHC: 33.3 g/dL (ref 30.0–36.0)
MCV: 83.5 fL (ref 78.0–100.0)
MONO ABS: 0.5 10*3/uL (ref 0.1–1.0)
Monocytes Relative: 7 % (ref 3–12)
NEUTROS ABS: 3.4 10*3/uL (ref 1.7–7.7)
Neutrophils Relative %: 48 % (ref 43–77)
Platelets: 338 10*3/uL (ref 150–400)
RBC: 4.24 MIL/uL (ref 3.87–5.11)
RDW: 13.9 % (ref 11.5–15.5)
WBC: 6.9 10*3/uL (ref 4.0–10.5)

## 2013-06-25 LAB — URINE MICROSCOPIC-ADD ON

## 2013-06-25 LAB — URINALYSIS, ROUTINE W REFLEX MICROSCOPIC
BILIRUBIN URINE: NEGATIVE
GLUCOSE, UA: NEGATIVE mg/dL
HGB URINE DIPSTICK: NEGATIVE
Ketones, ur: NEGATIVE mg/dL
Nitrite: NEGATIVE
PH: 6.5 (ref 5.0–8.0)
Protein, ur: NEGATIVE mg/dL
SPECIFIC GRAVITY, URINE: 1.024 (ref 1.005–1.030)
Urobilinogen, UA: 1 mg/dL (ref 0.0–1.0)

## 2013-06-25 LAB — LIPASE, BLOOD: Lipase: 45 U/L (ref 11–59)

## 2013-06-25 LAB — PREGNANCY, URINE: PREG TEST UR: NEGATIVE

## 2013-06-25 NOTE — ED Notes (Signed)
The pt is c/o of lt flank pain for 6 months just 2 weeks  Before each period.  lmp 2 weeks ago.  No vaginal  bleeding

## 2013-06-26 ENCOUNTER — Emergency Department (HOSPITAL_COMMUNITY)
Admission: EM | Admit: 2013-06-26 | Discharge: 2013-06-26 | Disposition: A | Discharge status: Home / Self Care | Payer: Self-pay | Attending: Emergency Medicine | Admitting: Emergency Medicine

## 2013-06-26 ENCOUNTER — Emergency Department (HOSPITAL_COMMUNITY): Payer: Self-pay

## 2013-06-26 DIAGNOSIS — R109 Unspecified abdominal pain: Secondary | ICD-10-CM

## 2013-06-26 MED ORDER — IOHEXOL 300 MG/ML  SOLN
100.0000 mL | Freq: Once | INTRAMUSCULAR | Status: AC | PRN
  Administered 2013-06-26: 100 mL via INTRAVENOUS

## 2013-06-26 MED ORDER — MORPHINE SULFATE 4 MG/ML IJ SOLN
4.0000 mg | Freq: Once | INTRAMUSCULAR | Status: AC
  Administered 2013-06-26: 4 mg via INTRAVENOUS
  Filled 2013-06-26: qty 1

## 2013-06-26 MED ORDER — IOHEXOL 300 MG/ML  SOLN
20.0000 mL | INTRAMUSCULAR | Status: AC
  Administered 2013-06-26: 25 mL via ORAL

## 2013-06-26 MED ORDER — SODIUM CHLORIDE 0.9 % IV BOLUS (SEPSIS)
1000.0000 mL | Freq: Once | INTRAVENOUS | Status: AC
  Administered 2013-06-26: 1000 mL via INTRAVENOUS

## 2013-06-26 MED ORDER — IBUPROFEN 600 MG PO TABS: 600.0000 mg | ORAL_TABLET | Freq: Four times a day (QID) | ORAL | Status: DC | PRN

## 2013-06-26 NOTE — ED Provider Notes (Signed)
CSN: [id number]     Arrival date & time 06/25/13  2256 History   First MD Initiated Contact with Patient 06/26/13 0129     Chief Complaint  Patient presents with  . Flank Pain     (Consider location/radiation/quality/duration/timing/severity/associated sxs/prior Treatment) HPI Comments: PT comes in with cc lf LLQ abd pain and flank pain. Pain x 6 months now, and is getting worse. The pain is described as dull pain, and is constant. Current pain started y'day. Typically she is in pain like this for 2 weeks out of month. She states that the pain usually lasts for 2 weeks and goes away. Pt has pain with urination, no urgency, no blood or discharge. Pt has no hx of abd surgery. Not see a PCP for this pain. Pt has not been taking any meds for pain. She denies nausea, emesis, and had 4 loose BM today, no blood, no fevers. No hx of renal stones, no uti like sx. Prior to me leaving, patient also mentioned that she is having trouble opening her jaw. There is no sore throat. There is no jaw pain, ear pain.  Patient is a 43 y.o. female presenting with flank pain. The history is provided by the patient and the spouse. The history is limited by a language barrier. A language interpreter was used Investment banker, corporate).  Flank Pain Pertinent negatives include no chest pain, no abdominal pain, no headaches and no shortness of breath.    Past Medical History  Diagnosis Date  . No pertinent past medical history   . Depression    Past Surgical History  Procedure Laterality Date  . Tubal ligation      7 years ago  . Cholecystectomy N/A 07/28/2012    Procedure: LAPAROSCOPIC CHOLECYSTECTOMY WITH INTRAOPERATIVE CHOLANGIOGRAM;  Surgeon: Imogene Burn. Georgette Dover, MD;  Location: [facility];  Service: General;  Laterality: N/A;   Family History  Problem Relation Age of Onset  . Hypertension Mother   . Heart disease Father   . Anesthesia problems Neg Hx   . Diabetes Mother    History  Substance Use Topics  . Smoking status: Never Smoker    . Smokeless tobacco: Not on file  . Alcohol Use: No   OB History   Grav Para Term Preterm Abortions TAB SAB Ect Mult Living   4 4 4      1 5      Review of Systems  Constitutional: Negative for activity change.  HENT: Negative for drooling and sore throat.   Respiratory: Negative for shortness of breath.   Cardiovascular: Negative for chest pain.  Gastrointestinal: Negative for nausea, vomiting and abdominal pain.  Genitourinary: Positive for flank pain. Negative for dysuria.  Musculoskeletal: Negative for neck pain.  Allergic/Immunologic: Negative for immunocompromised state.  Neurological: Negative for dizziness, weakness, numbness and headaches.  Psychiatric/Behavioral: Negative for confusion.      Allergies  Review of patient's allergies indicates no known allergies.  Home Medications   Prior to Admission medications   Medication Sig Start Date End Date Taking? Authorizing Provider  ibuprofen (ADVIL,MOTRIN) 200 MG tablet Take 200 mg by mouth every 6 (six) hours as needed for moderate pain.   Yes Historical Provider, MD  methocarbamol (ROBAXIN) 500 MG tablet Take one in the morning, one in the afternoon, and 2 at bedtime for muscle relaxant 03/28/13  Yes Posey Boyer, MD  ibuprofen (ADVIL,MOTRIN) 600 MG tablet Take 1 tablet (600 mg total) by mouth every 6 (six) hours as needed. 06/26/13  Varney Biles, MD   BP 105/63  Pulse 58  Temp(Src) 98.1 F (36.7 C) (Oral)  Resp 17  SpO2 100%  LMP 06/11/2013 Physical Exam  Nursing note and vitals reviewed. Constitutional: She is oriented to person, place, and time. She appears well-developed and well-nourished.  HENT:  Head: Normocephalic and atraumatic.  Eyes: Conjunctivae and EOM are normal. Pupils are equal, round, and reactive to light.  Neck: Normal range of motion. Neck supple.  Cardiovascular: Normal rate, regular rhythm, normal heart sounds and intact distal pulses.   No murmur heard. Pulmonary/Chest: Effort  normal. No respiratory distress. She has no wheezes.  Abdominal: Soft. Bowel sounds are normal. She exhibits no distension. There is tenderness. There is no rebound and no guarding.  Suprapubic tenderness  Genitourinary: Vagina normal and uterus normal.  External exam - normal, no lesions Speculum exam: Pt has some white discharge, no blood Bimanual exam: Patient has no CMT, no adnexal tenderness or fullness and cervical os is closed  Neurological: She is alert and oriented to person, place, and time. She displays normal reflexes. No cranial nerve deficit. She exhibits normal muscle tone. Coordination normal.  Skin: Skin is warm and dry.    ED Course  Procedures (including critical care time) Labs Review Labs Reviewed  URINALYSIS, ROUTINE W REFLEX MICROSCOPIC - Abnormal; Notable for the following:    APPearance CLOUDY (*)    Leukocytes, UA LARGE (*)    All other components within normal limits  CBC WITH DIFFERENTIAL - Abnormal; Notable for the following:    Hemoglobin 11.8 (*)    HCT 35.4 (*)    All other components within normal limits  COMPREHENSIVE METABOLIC PANEL - Abnormal; Notable for the following:    Glucose, Bld 103 (*)    Total Bilirubin 0.2 (*)    All other components within normal limits  URINE MICROSCOPIC-ADD ON - Abnormal; Notable for the following:    Squamous Epithelial / LPF MANY (*)    Bacteria, UA FEW (*)    All other components within normal limits  PREGNANCY, URINE  LIPASE, BLOOD    Imaging Review No results found.   EKG Interpretation None      MDM   Final diagnoses:  Right sided abdominal pain  Jaw pain  Pt comes in with cc of abd pain. Upreg is neg. No peritoneal signs. Pain is intermittent x 6 months, lasts for about 2 weeks. Unsure if associated with periods. Also has right flank pain. No hx of renal stones, no UTI like sx, UA is not convincing. CT ordered - as with flank pain and intermittent nature to this pain -  Wanted to ensure  there was no renal stones and/or any large ovarian cyst. Very low suspicion for ovarian cyst - at least whilst patient was under my care, as her pain is not severe.  Likely needs outpatient [place]. Return precautions discussed. Pt given med resources and gyne info. Likely will benefit from US pelvis.    Varney Biles, MD 06/29/13 [id number]

## 2013-06-26 NOTE — Discharge Instructions (Signed)
Dolor abdominal en las mujeres  (Abdominal Pain, Women)  El dolor abdominal (en el estómago, la pelvis o el vientre) puede tener muchas causas. Es importante que le informe a su médico:  · La ubicación del dolor.  · ¿Viene y se va, o persiste todo el tiempo?  · ¿Hay situaciones que inician el dolor (comer ciertos alimentos, la actividad física)?  · ¿Tiene otros síntomas asociados al dolor (fiebre, náuseas, vómitos, diarrea)?  Todo es de gran ayuda cuando se trata de hallar la causa del dolor.  CAUSAS  · Estómago: Infecciones por virus o bacterias, o úlcera.  · Intestino: Apendicitis (apéndice inflamado), ileitis regional (enfermedad de Crohn), colitis ulcerosa (colon inflamado), síndrome del colon irritable, diverticulitis (inflamación de los divertículos del colon) o cáncer de estómago oo intestino.  · Enfermedades de la vesícula biliar o cálculos.  · Enfermedades renales, cálculos o infecciones en el riñón.  · Infección o cáncer del páncreas.  · Fibromialgia (trastorno doloroso)  · Enfermedades de los órganos femeninos:  · Uterus: Útero: fibroma (tumor no canceroso) o infección  · Trompas de Falopio: infección o embarazo ectópico  · En los ovarios, quistes o tumores.  · Adherencias pélvicas (tejido cicatrizal).  · Endometriosis (el tejido que cubre el útero se desarrolla en la pelvis y los órganos pélvicos).  · Síndrome de congestión pélvica (los órganos femeninos se llenan de sangre antes del periodo menstrual(  · Dolor durante el periodo menstrual.  · Dolor durante la ovulación (al producir óvulos).  · Dolor al usar el DIU (dispositivo intrauterino para el control de la natalidad)  · Cáncer en los órganos femeninos.  · Dolor funcional (no está originado en una enfermedad, puede mejorar sin tratamiento).  · Dolor de origen psicológico  · Depresión.  DIAGNÓSTICO  Su médico decidirá la gravedad del dolor a través del examen físico  · Análisis de sangre  · Radiografías  · Ecografías  · TC (tomografía computada, tipo  especial de radiografías).  · IMR (resonancia magnética)  · Cultivos, en el caso una infección  · Colon por enema de bario (se inserta una sustancia de contraste en el intestino grueso para mejorar la observación con rayos X.)  · Colonoscopía (observación del intestino con un tubo luminoso).  · Laparoscopía (examen del interior del abdomen con un tubo que tiene una luz).  · Cirugía exploratoria abdominal mayor (se observa el abdomen realizando una gran incisión).  TRATAMIENTO  El tratamiento dependerá de la causa del problema.   · Muchos de estos casos pueden controlarse y tratarse en casa.  · Medicamentos de venta libre indicados por el médico.  · Medicamentos con receta.  · Antibióticos, en caso de infección  · Píldoras anticonceptivas, en el caso de períodos dolorosos o dolor al ovular.  · Tratamiento hormonal, para la endometriosis  · Inyecciones para bloqueo nervioso selectivo.  · Fisioterapia.  · Antidepresivos.  · Consejos por parte de un psícólogo o psiquiatra.  · Cirugía mayor o menor.  INSTRUCCIONES PARA EL CUIDADO DOMICILIARIO  · No tome ni administre laxantes a menos que se lo haya indicado su médico.  · Tome analgésicos de venta libre sólo si se lo ha indicado el profesional que lo asiste. No tome aspirina, ya que puede causar molestias en el estómago o hemorragias.  · Consuma una dieta líquida (caldo o agua) según lo indicado por el médico. Progrese lentamente a una dieta blanda, según la tolerancia, si el dolor se relaciona con el estómago o el intestino.  ·   Tenga un termómetro y tómese la temperatura varias veces al día.  · Haga reposo en la cama y duerma, si esto alivia el dolor.  · Evite las relaciones sexuales, si le producen dolor.  · Evite las situaciones estresantes.  · Cumpla con las visitas y los análisis de control, según las indicaciones de su médico.  · Si el dolor no se alivia con los medicamentos o la cirugía, puede tratar con:  · Acupuntura.  · Ejercicios de relajación (yoga,  meditación).  · Terapia grupal.  · Psicoterapia.  SOLICITE ATENCIÓN MÉDICA SI:  · Nota que ciertos alimentos le producen dolor de estómago.  · El tratamiento indicado para realizar en el hogar no le alivia el dolor.  · Necesita analgésicos más fuertes.  · Quiere que le retiren el DIU.  · Si se siente confundido o desfalleciente.  · Presenta náuseas o vómitos.  · Aparece una erupción cutánea.  · Sufre efectos adversos o una reacción alérgica debido a los medicamentos que toma.  SOLICITE ATENCIÓN MÉDICA DE INMEDIATO SI:  · El dolor persiste o se agrava.  · Tiene fiebre.  · Siente el dolor sólo en algunos sectores del abdomen. Si se localiza en la zona derecha, posiblemente podría tratarse de apendicitis. En un adulto, si se localiza en la región inferior izquierda del abdomen, podría tratarse de colitis o diverticulitis.  · Hay sangre en las heces (deposiciones de color rojo brillante o negro alquitranado), con o sin vómitos.  · Usted presenta sangre en la orina.  · Siente escalofríos con o sin fiebre.  · Se desmaya.  ASEGÚRESE QUE:   · Comprende estas instrucciones.  · Controlará su enfermedad.  · Solicitará ayuda de inmediato si no mejora o si empeora.  Document Released: 05/01/2008 Document Revised: 04/07/2011  Exit[employer]® Patient Information ©2014 [employer].

## 2013-06-26 NOTE — ED Notes (Signed)
CT made aware that pt has finished contrast. 

## 2013-07-04 ENCOUNTER — Telehealth: Payer: Self-pay

## 2013-07-04 NOTE — Telephone Encounter (Signed)
Called per interpreter to  inform about lab work from 5/29. Patient told patient: cholesterol- 153, HDL- 52, LDL- 86, triglycerides - 75, Bld Glucose -94 and HBG-AiC - 5.6.   Patient told labs were normal;. Informed if interested in getting PCP to come back for appointment and would help her with orange card application.

## 2013-10-20 ENCOUNTER — Other Ambulatory Visit (HOSPITAL_COMMUNITY): Payer: Self-pay | Admitting: *Deleted

## 2013-10-20 DIAGNOSIS — N6320 Unspecified lump in the left breast, unspecified quadrant: Secondary | ICD-10-CM

## 2013-10-24 ENCOUNTER — Inpatient Hospital Stay (HOSPITAL_COMMUNITY)
Admission: AD | Admit: 2013-10-24 | Discharge: 2013-10-24 | Disposition: A | Discharge status: Home / Self Care | Payer: Self-pay | Source: Ambulatory Visit | Attending: Family Medicine | Admitting: Family Medicine

## 2013-10-24 ENCOUNTER — Encounter (HOSPITAL_COMMUNITY): Payer: Self-pay | Admitting: *Deleted

## 2013-10-24 ENCOUNTER — Inpatient Hospital Stay (HOSPITAL_COMMUNITY): Payer: Self-pay

## 2013-10-24 DIAGNOSIS — N949 Unspecified condition associated with female genital organs and menstrual cycle: Secondary | ICD-10-CM | POA: Insufficient documentation

## 2013-10-24 DIAGNOSIS — R102 Pelvic and perineal pain: Secondary | ICD-10-CM

## 2013-10-24 DIAGNOSIS — N390 Urinary tract infection, site not specified: Secondary | ICD-10-CM | POA: Insufficient documentation

## 2013-10-24 LAB — CBC
HCT: 36.7 % (ref 36.0–46.0)
Hemoglobin: 12.1 g/dL (ref 12.0–15.0)
MCH: 27.6 pg (ref 26.0–34.0)
MCHC: 33 g/dL (ref 30.0–36.0)
MCV: 83.6 fL (ref 78.0–100.0)
PLATELETS: 306 10*3/uL (ref 150–400)
RBC: 4.39 MIL/uL (ref 3.87–5.11)
RDW: 14.5 % (ref 11.5–15.5)
WBC: 6.2 10*3/uL (ref 4.0–10.5)

## 2013-10-24 LAB — URINE MICROSCOPIC-ADD ON

## 2013-10-24 LAB — URINALYSIS, ROUTINE W REFLEX MICROSCOPIC
BILIRUBIN URINE: NEGATIVE
GLUCOSE, UA: NEGATIVE mg/dL
Ketones, ur: NEGATIVE mg/dL
Nitrite: NEGATIVE
Protein, ur: NEGATIVE mg/dL
Urobilinogen, UA: 0.2 mg/dL (ref 0.0–1.0)
pH: 6 (ref 5.0–8.0)

## 2013-10-24 LAB — POCT PREGNANCY, URINE: PREG TEST UR: NEGATIVE

## 2013-10-24 LAB — WET PREP, GENITAL
Clue Cells Wet Prep HPF POC: NONE SEEN
TRICH WET PREP: NONE SEEN
YEAST WET PREP: NONE SEEN

## 2013-10-24 LAB — HIV ANTIBODY (ROUTINE TESTING W REFLEX): HIV: NONREACTIVE

## 2013-10-24 MED ORDER — CEPHALEXIN 500 MG PO CAPS: 500.0000 mg | ORAL_CAPSULE | Freq: Four times a day (QID) | ORAL | Status: DC

## 2013-10-24 NOTE — MAU Provider Note (Signed)
Attestation of Attending Supervision of Advanced Practitioner (PA/CNM/NP): Evaluation and management procedures were performed by the Advanced Practitioner under my supervision and collaboration.  I have reviewed the Advanced Practitioner's note and chart, and I agree with the management and plan.  Jacob Stinson, DO Attending Physician Faculty Practice, [facility]  

## 2013-10-24 NOTE — Progress Notes (Signed)
I assisted Anselm Jungling and Chiropodist with questions. [place]

## 2013-10-24 NOTE — MAU Note (Signed)
Pt presents to MAU with complaints of pain in her lower abdomen that radiates down into her lower back, has trouble walking at times.

## 2013-10-24 NOTE — MAU Note (Signed)
Pain in lower abd, around to back, down legs. No bleeding

## 2013-10-24 NOTE — MAU Provider Note (Signed)
History     CSN: [id number]  Arrival date and time: 10/24/13 3716   First Provider Initiated Contact with Patient 10/24/13 [phone]      Chief Complaint  Patient presents with  . Abdominal Pain   HPI Comments: Crystal [name] 43 y.o. [id number] presents to MAU with left sided pelvic and back pain that radiates down her leg. This pain has been ongoing for 3 years. She feels it has gotten worse in the last 2 months. She is only taking OTC medications. She is having some constipation taking up t one week to have bowel movement and no bladder issues. She has had 20 lb weight loss in last 6 months. She has new sexual partner. BTL for contraception. She went to a Clinic and had X-Ray but did not keep her follow up appointment.   Abdominal Pain      Past Medical History  Diagnosis Date  . No pertinent past medical history   . Depression     Past Surgical History  Procedure Laterality Date  . Tubal ligation      7 years ago  . Cholecystectomy N/A 07/28/2012    Procedure: LAPAROSCOPIC CHOLECYSTECTOMY WITH INTRAOPERATIVE CHOLANGIOGRAM;  Surgeon: Imogene Burn. Georgette Dover, MD;  Location: [facility];  Service: General;  Laterality: N/A;    Family History  Problem Relation Age of Onset  . Hypertension Mother   . Heart disease Father   . Anesthesia problems Neg Hx   . Diabetes Mother     History  Substance Use Topics  . Smoking status: Never Smoker   . Smokeless tobacco: Not on file  . Alcohol Use: No    Allergies: No Known Allergies  Prescriptions prior to admission  Medication Sig Dispense Refill  . ibuprofen (ADVIL,MOTRIN) 200 MG tablet Take 600 mg by mouth every 6 (six) hours as needed for mild pain or moderate pain.         Review of Systems  Constitutional: Negative.   HENT: Negative.   Eyes: Negative.   Respiratory: Negative.   Cardiovascular: Negative.   Gastrointestinal: Positive for abdominal pain.  Musculoskeletal: Positive for back pain.  Skin: Negative.    Endo/Heme/Allergies: Negative.   Psychiatric/Behavioral: Negative.    Physical Exam   There were no vitals taken for this visit.  Physical Exam  Constitutional: She is oriented to person, place, and time. She appears well-developed and well-nourished. No distress.  Spanish speaking/ Interpreter used   HENT:  Head: Normocephalic and atraumatic.  Eyes: Conjunctivae are normal. Pupils are equal, round, and reactive to light.  Cardiovascular: Normal rate, regular rhythm and normal heart sounds.   Respiratory: Effort normal and breath sounds normal. No respiratory distress. She has no wheezes.  GI: Soft. Bowel sounds are normal. She exhibits no distension. There is tenderness. There is no rebound.  Left lower quad  Genitourinary:  Genital:external negative Vaginal:small amount pink discharge Cervix:closed/ thick Bimanual:left Ovarian tenderness otherwise negative   Musculoskeletal: Normal range of motion.  Neurological: She is alert and oriented to person, place, and time.  Skin: Skin is warm and dry.  Psychiatric: She has a normal mood and affect. Her behavior is normal. Judgment and thought content normal.   Results for orders placed during the hospital encounter of 10/24/13 (from the past 24 hour(s))  URINALYSIS, ROUTINE W REFLEX MICROSCOPIC     Status: Abnormal   Collection Time    10/24/13  8:25 AM      Result Value Ref Range   Color, Urine YELLOW  YELLOW   APPearance CLEAR  CLEAR   Specific Gravity, Urine <1.005 (*) 1.005 - 1.030   pH 6.0  5.0 - 8.0   Glucose, UA NEGATIVE  NEGATIVE mg/dL   Hgb urine dipstick MODERATE (*) NEGATIVE   Bilirubin Urine NEGATIVE  NEGATIVE   Ketones, ur NEGATIVE  NEGATIVE mg/dL   Protein, ur NEGATIVE  NEGATIVE mg/dL   Urobilinogen, UA 0.2  0.0 - 1.0 mg/dL   Nitrite NEGATIVE  NEGATIVE   Leukocytes, UA MODERATE (*) NEGATIVE  URINE MICROSCOPIC-ADD ON     Status: Abnormal   Collection Time    10/24/13  8:25 AM      Result Value Ref Range    Squamous Epithelial / LPF MANY (*) RARE   WBC, UA 3-6  <3 WBC/hpf   Bacteria, UA FEW (*) RARE  POCT PREGNANCY, URINE     Status: None   Collection Time    10/24/13  8:38 AM      Result Value Ref Range   Preg Test, Ur NEGATIVE  NEGATIVE  WET PREP, GENITAL     Status: Abnormal   Collection Time    10/24/13  9:20 AM      Result Value Ref Range   Yeast Wet Prep HPF POC NONE SEEN  NONE SEEN   Trich, Wet Prep NONE SEEN  NONE SEEN   Clue Cells Wet Prep HPF POC NONE SEEN  NONE SEEN   WBC, Wet Prep HPF POC MODERATE (*) NONE SEEN  CBC     Status: None   Collection Time    10/24/13  9:46 AM      Result Value Ref Range   WBC 6.2  4.0 - 10.5 K/uL   RBC 4.39  3.87 - 5.11 MIL/uL   Hemoglobin 12.1  12.0 - 15.0 g/dL   HCT 36.7  36.0 - 46.0 %   MCV 83.6  78.0 - 100.0 fL   MCH 27.6  26.0 - 34.0 pg   MCHC 33.0  30.0 - 36.0 g/dL   RDW 14.5  11.5 - 15.5 %   Platelets 306  150 - 400 K/uL   US Transvaginal Non-ob  10/24/2013   CLINICAL DATA:  Left-sided pelvic pain.  EXAM: TRANSABDOMINAL ULTRASOUND OF PELVIS  TECHNIQUE: Transabdominal ultrasound examination of the pelvis was performed including evaluation of the uterus, ovaries, adnexal regions, and pelvic cul-de-sac.  COMPARISON:  10/02/2010  FINDINGS: Uterus  Measurements: 9.7 x 5.8 x 7.5 cm. 2.3 cm intramural fibroid is identified in the posterior lower uterine segment, as before.  Endometrium  Thickness: 10 mm.  No focal abnormality.  Right ovary  Measurements: 3.8 x 1.7 x 2.9 cm. 16 mm hemorrhagic follicle. Otherwise unremarkable.  Left ovary  Measurements: . Not visualized on transvaginal or transabdominal scanning. No evidence for left adnexal mass.  Other findings:  No intraperitoneal free fluid.  IMPRESSION: Stable. No findings to explain the patient's history of left pelvic pain.   Electronically Signed   By: Misty Stanley M.D.   On: 10/24/2013 11:01   US Pelvis Complete  10/24/2013   CLINICAL DATA:  Left-sided pelvic pain.  EXAM: TRANSABDOMINAL  ULTRASOUND OF PELVIS  TECHNIQUE: Transabdominal ultrasound examination of the pelvis was performed including evaluation of the uterus, ovaries, adnexal regions, and pelvic cul-de-sac.  COMPARISON:  10/02/2010  FINDINGS: Uterus  Measurements: 9.7 x 5.8 x 7.5 cm. 2.3 cm intramural fibroid is identified in the posterior lower uterine segment, as before.  Endometrium  Thickness: 10 mm.  No  focal abnormality.  Right ovary  Measurements: 3.8 x 1.7 x 2.9 cm. 16 mm hemorrhagic follicle. Otherwise unremarkable.  Left ovary  Measurements: . Not visualized on transvaginal or transabdominal scanning. No evidence for left adnexal mass.  Other findings:  No intraperitoneal free fluid.  IMPRESSION: Stable. No findings to explain the patient's history of left pelvic pain.   Electronically Signed   By: Misty Stanley M.D.   On: 10/24/2013 11:01     MAU Course  Procedures  MDM Pelvic ultrasound now CBC, HIV, GC, Chlamydia   Assessment and Plan  A: UTI Pelvic Pain Back Pain  P: Keflex 500 mg 4 x day x 7 days Increase fluids Advised to find Orthopedist  Return to MAU as needed  Georgia Duff 10/24/2013, 9:28 AM

## 2013-10-24 NOTE — Discharge Instructions (Signed)
Dolor abdominal (Abdominal Pain) El dolor puede tener muchas causas. Normalmente la causa del dolor abdominal no es una enfermedad y Teacher, English as a foreign language sin Clinical research associate. Frecuentemente puede controlarse y tratarse en casa. Su mdico le Chartered certified accountant examen fsico y posiblemente solicite anlisis de sangre y radiografas para ayudar a Teacher, adult education la gravedad de su dolor. Sin embargo, en [employer], debe transcurrir ms tiempo antes de que se pueda Pension scheme manager una causa evidente del dolor. Antes de llegar a ese punto, es posible que su mdico no sepa si necesita ms pruebas o un tratamiento ms profundo. INSTRUCCIONES PARA EL CUIDADO EN EL HOGAR  Est atento al dolor para ver si hay cambios. Las siguientes indicaciones ayudarn a Chief Strategy Officer que pueda sentir:  [place] solo medicamentos de venta libre o recetados, segn las indicaciones del mdico.  No tome laxantes a menos que se lo haya indicado su mdico.  Pruebe con Ardelia Mems dieta lquida absoluta (caldo, t o agua) segn se lo indique su mdico. Introduzca gradualmente una dieta normal, segn su tolerancia. SOLICITE ATENCIN MDICA SI:  Tiene dolor abdominal sin explicacin.  Tiene dolor abdominal relacionado con nuseas o diarrea.  Tiene dolor cuando orina o defeca.  Experimenta dolor abdominal que lo despierta de noche.  Tiene dolor abdominal que empeora o mejora cuando come alimentos.  Tiene dolor abdominal que empeora cuando come alimentos grasosos.  Tiene fiebre. SOLICITE ATENCIN MDICA DE INMEDIATO SI:   El dolor no desaparece en un plazo mximo de 2horas.  No deja de (vomitar).  El Social research officer, government se siente solo en partes del abdomen, como el lado derecho o la parte inferior izquierda del abdomen.  Evaca materia fecal sanguinolenta o negra, de aspecto alquitranado. ASEGRESE DE QUE:  Comprende estas instrucciones.  Controlar su afeccin.  Recibir ayuda de inmediato si no mejora o si empeora. Document Released: 01/13/2005  Document Revised: 01/18/2013 [facility] Patient Information 2015 [place]. This information is not intended to replace advice given to you by your health care provider. Make sure you discuss any questions you have with your health care provider.

## 2013-10-25 LAB — URINE CULTURE
Colony Count: >=100000
Special Requests: NORMAL

## 2013-10-25 LAB — GC/CHLAMYDIA PROBE AMP
CT Probe RNA: NEGATIVE
GC PROBE AMP APTIMA: NEGATIVE

## 2013-11-08 ENCOUNTER — Other Ambulatory Visit: Payer: Self-pay

## 2013-11-08 ENCOUNTER — Ambulatory Visit (HOSPITAL_COMMUNITY): Payer: Self-pay | Attending: Obstetrics and Gynecology

## 2013-11-28 ENCOUNTER — Encounter (HOSPITAL_COMMUNITY): Payer: Self-pay | Admitting: *Deleted

## 2013-12-21 ENCOUNTER — Encounter (HOSPITAL_COMMUNITY): Payer: Self-pay

## 2013-12-21 ENCOUNTER — Ambulatory Visit
Admission: RE | Admit: 2013-12-21 | Discharge: 2013-12-21 | Disposition: A | Discharge status: Home / Self Care | Payer: No Typology Code available for payment source | Source: Ambulatory Visit | Attending: Obstetrics and Gynecology | Admitting: Obstetrics and Gynecology

## 2013-12-21 ENCOUNTER — Ambulatory Visit (HOSPITAL_COMMUNITY)
Admission: RE | Admit: 2013-12-21 | Discharge: 2013-12-21 | Disposition: A | Discharge status: Home / Self Care | Payer: Self-pay | Source: Ambulatory Visit | Attending: Obstetrics and Gynecology | Admitting: Obstetrics and Gynecology

## 2013-12-21 VITALS — BP 112/68 | Ht 62.0 in | Wt 165.4 lb

## 2013-12-21 DIAGNOSIS — N6320 Unspecified lump in the left breast, unspecified quadrant: Secondary | ICD-10-CM

## 2013-12-21 DIAGNOSIS — Z1239 Encounter for other screening for malignant neoplasm of breast: Secondary | ICD-10-CM

## 2013-12-21 NOTE — Patient Instructions (Signed)
Explained to Crystal [name] that she did not need a Pap smear today due to last Pap smear was 10/16/2011. Let her know BCCCP will cover Pap smears every 3 years unless has a history of abnormal Pap smears and that her next Pap smear is due in September 2016. Referred patient to the [facility] for diagnostic mammogram. Appointment scheduled for Wednesday, December 21, 2013 at 1500. Patient aware of appointment and will be there. [name] verbalized understanding.  [name], Arvil Chaco, RN 2:25 PM

## 2013-12-21 NOTE — Progress Notes (Signed)
Complaints of left breast lump x 4 weeks that is painful at times. Patient rated pain at a 4 out of 10.  Pap Smear:  Pap smear not completed today. Last Pap smear was 10/16/2011 at the [facility] and normal. Per patient has no history of an abnormal Pap smear. Last Pap smear result is in EPIC.  Physical exam: Breasts Breasts symmetrical. No skin abnormalities bilateral breasts. No nipple retraction bilateral breasts. No nipple discharge bilateral breasts. No lymphadenopathy. No lumps palpated right breast. Palpated two lumps within the left breast at 1 o'clock 2 cm from the nipple and 12 o'clock 5 cm from the nipple. No complaints of pain or tenderness on exam. Referred patient to the [facility] for diagnostic mammogram. Appointment scheduled for Wednesday, December 21, 2013 at 1500.  Pelvic/Bimanual No Pap smear completed today since last Pap smear was 10/16/2011. Pap smear not indicated per BCCCP guidelines.

## 2014-05-18 ENCOUNTER — Emergency Department (HOSPITAL_COMMUNITY)
Admission: EM | Admit: 2014-05-18 | Discharge: 2014-05-18 | Disposition: A | Discharge status: Home / Self Care | Payer: No Typology Code available for payment source | Attending: Emergency Medicine | Admitting: Emergency Medicine

## 2014-05-18 ENCOUNTER — Encounter (HOSPITAL_COMMUNITY): Payer: Self-pay | Admitting: Emergency Medicine

## 2014-05-18 DIAGNOSIS — H539 Unspecified visual disturbance: Secondary | ICD-10-CM | POA: Insufficient documentation

## 2014-05-18 DIAGNOSIS — R110 Nausea: Secondary | ICD-10-CM | POA: Insufficient documentation

## 2014-05-18 DIAGNOSIS — Z8659 Personal history of other mental and behavioral disorders: Secondary | ICD-10-CM | POA: Insufficient documentation

## 2014-05-18 DIAGNOSIS — M436 Torticollis: Secondary | ICD-10-CM | POA: Insufficient documentation

## 2014-05-18 LAB — CBC WITH DIFFERENTIAL/PLATELET
BASOS PCT: 0 % (ref 0–1)
Basophils Absolute: 0 10*3/uL (ref 0.0–0.1)
EOS ABS: 0.2 10*3/uL (ref 0.0–0.7)
EOS PCT: 3 % (ref 0–5)
HCT: 37.2 % (ref 36.0–46.0)
Hemoglobin: 12.4 g/dL (ref 12.0–15.0)
Lymphocytes Relative: 31 % (ref 12–46)
Lymphs Abs: 2.2 10*3/uL (ref 0.7–4.0)
MCH: 28.6 pg (ref 26.0–34.0)
MCHC: 33.3 g/dL (ref 30.0–36.0)
MCV: 85.7 fL (ref 78.0–100.0)
MONOS PCT: 6 % (ref 3–12)
Monocytes Absolute: 0.4 10*3/uL (ref 0.1–1.0)
NEUTROS PCT: 60 % (ref 43–77)
Neutro Abs: 4.2 10*3/uL (ref 1.7–7.7)
PLATELETS: 299 10*3/uL (ref 150–400)
RBC: 4.34 MIL/uL (ref 3.87–5.11)
RDW: 13.8 % (ref 11.5–15.5)
WBC: 7.1 10*3/uL (ref 4.0–10.5)

## 2014-05-18 LAB — COMPREHENSIVE METABOLIC PANEL
ALT: 17 U/L (ref 0–35)
ANION GAP: 8 (ref 5–15)
AST: 22 U/L (ref 0–37)
Albumin: 3.4 g/dL — ABNORMAL LOW (ref 3.5–5.2)
Alkaline Phosphatase: 71 U/L (ref 39–117)
BUN: 8 mg/dL (ref 6–23)
CALCIUM: 8.3 mg/dL — AB (ref 8.4–10.5)
CO2: 27 mmol/L (ref 19–32)
CREATININE: 0.61 mg/dL (ref 0.50–1.10)
Chloride: 103 mmol/L (ref 96–112)
GFR calc non Af Amer: >90 mL/min (ref >90)
Glucose, Bld: 106 mg/dL — ABNORMAL HIGH (ref 70–99)
Potassium: 3.8 mmol/L (ref 3.5–5.1)
Sodium: 138 mmol/L (ref 135–145)
Total Bilirubin: 0.4 mg/dL (ref 0.3–1.2)
Total Protein: 7.3 g/dL (ref 6.0–8.3)

## 2014-05-18 MED ORDER — CYCLOBENZAPRINE HCL 10 MG PO TABS: 5.0000 mg | ORAL_TABLET | Freq: Once | ORAL | Status: DC

## 2014-05-18 MED ORDER — NAPROXEN 500 MG PO TABS: 500.0000 mg | ORAL_TABLET | Freq: Two times a day (BID) | ORAL | Status: DC

## 2014-05-18 MED ORDER — CYCLOBENZAPRINE HCL 10 MG PO TABS
10.0000 mg | ORAL_TABLET | Freq: Once | ORAL | Status: AC
  Administered 2014-05-18: 10 mg via ORAL
  Filled 2014-05-18: qty 1

## 2014-05-18 MED ORDER — CYCLOBENZAPRINE HCL 5 MG PO TABS: 5.0000 mg | ORAL_TABLET | Freq: Two times a day (BID) | ORAL | Status: DC | PRN

## 2014-05-18 NOTE — ED Notes (Signed)
Pt. reports headache onset today and intermittent right facial numbness for 3 weeks . Speech clear / no facial asymmetry.

## 2014-05-18 NOTE — ED Notes (Signed)
Pt A&OX4, ambulatory at d/c with steady gait, NAD 

## 2014-05-18 NOTE — ED Provider Notes (Signed)
CSN: [id number]     Arrival date & time 05/18/14  1912 History   First MD Initiated Contact with Patient 05/18/14 2027     Chief Complaint  Patient presents with  . Headache     (Consider location/radiation/quality/duration/timing/severity/associated sxs/prior Treatment) Patient is a 44 y.o. female presenting with headaches. The history is provided by the patient. The history is limited by a language barrier. A language interpreter was used.  Headache Pain location:  R temporal Quality:  Sharp Radiates to:  Does not radiate Severity currently:  5/10 Onset quality:  Sudden Duration:  2 weeks Progression:  Waxing and waning Chronicity:  New Similar to prior headaches: yes   Relieved by:  Nothing Worsened by:  Neck movement Ineffective treatments:  NSAIDs Associated symptoms: nausea and neck pain   Associated symptoms: no abdominal pain, no back pain, no congestion, no cough, no ear pain, no eye pain, no fever, no loss of balance, no photophobia and no sore throat  Numbness: of face.    Crystal [name] is a 44 y.o. female who presents to the ED with right side neck and headache that started about 2 weeks ago. She has taken advil off and on and it does help. The pain increases when she turns her neck. She doesn't remember any injury she just noticed one morning that it was there. The pain radiates from the right side of the neck to the right side of the face and makes it feel numb sometimes. The pain also goes to the temporal area.   Past Medical History  Diagnosis Date  . No pertinent past medical history   . Depression    Past Surgical History  Procedure Laterality Date  . Tubal ligation      7 years ago  . Cholecystectomy N/A 07/28/2012    Procedure: LAPAROSCOPIC CHOLECYSTECTOMY WITH INTRAOPERATIVE CHOLANGIOGRAM;  Surgeon: Imogene Burn. Georgette Dover, MD;  Location: [facility];  Service: General;  Laterality: N/A;   Family History  Problem Relation Age of Onset  . Hypertension Mother     . Diabetes Mother   . Heart disease Father   . Anesthesia problems Neg Hx    History  Substance Use Topics  . Smoking status: Never Smoker   . Smokeless tobacco: Not on file  . Alcohol Use: No   OB History    Gravida Para Term Preterm AB TAB SAB Ectopic Multiple Living   4 4 4      1 5      Review of Systems  Constitutional: Negative for fever and chills.  HENT: Negative for congestion, ear pain and sore throat.   Eyes: Positive for visual disturbance. Negative for photophobia and pain.  Respiratory: Negative for cough and shortness of breath.   Cardiovascular: Negative for chest pain and leg swelling.  Gastrointestinal: Positive for nausea. Negative for abdominal pain.  Genitourinary: Negative for dysuria, urgency and frequency.  Musculoskeletal: Positive for neck pain. Negative for back pain.  Skin: Negative for rash.  Neurological: Positive for headaches. Negative for loss of balance. Numbness: of face.  Psychiatric/Behavioral: Negative for confusion. The patient is not nervous/anxious.       Allergies  Review of patient's allergies indicates no known allergies.  Home Medications   Prior to Admission medications   Medication Sig Start Date End Date Taking? Authorizing Provider  cephALEXin (KEFLEX) 500 MG capsule Take 1 capsule (500 mg total) by mouth 4 (four) times daily. Patient not taking: Reported on 12/21/2013 10/24/13   Olegario Messier,  NP  cyclobenzaprine (FLEXERIL) 5 MG tablet Take 1 tablet (5 mg total) by mouth 2 (two) times daily as needed for muscle spasms. 05/18/14   Pollyann Roa Bunnie Pion, NP  naproxen (NAPROSYN) 500 MG tablet Take 1 tablet (500 mg total) by mouth 2 (two) times daily. 05/18/14   Jarret Torre Bunnie Pion, NP   BP 104/75 mmHg  Pulse 76  Temp(Src) 98 F (36.7 C) (Oral)  Resp 14  Ht 5\' 2"  (1.575 m)  Wt 175 lb (79.379 kg)  BMI 32.00 kg/m2  SpO2 100%  LMP 05/09/2014 Physical Exam  Constitutional: She is oriented to person, place, and time. She appears  well-developed and well-nourished. No distress.  HENT:  Head: Normocephalic and atraumatic.  Right Ear: Tympanic membrane normal.  Left Ear: Tympanic membrane normal.  Nose: Nose normal.  Mouth/Throat: Uvula is midline, oropharynx is clear and moist and mucous membranes are normal.  Eyes: Conjunctivae and EOM are normal.  Neck: Normal range of motion. Neck supple. Muscular tenderness present. No spinous process tenderness present.    Cardiovascular: Normal rate and regular rhythm.   Pulmonary/Chest: Effort normal. She has no wheezes. She has no rales.  Abdominal: Soft. Bowel sounds are normal. She exhibits no mass. There is no tenderness.  Musculoskeletal: She exhibits no edema.  Radial and pedal pulses strong, adequate circulation, good touch sensation.  Neurological: She is alert and oriented to person, place, and time. She has normal strength. No cranial nerve deficit or sensory deficit. She displays a negative Romberg sign. Gait normal.  Reflex Scores:      Bicep reflexes are 2+ on the right side and 2+ on the left side.      Brachioradialis reflexes are 2+ on the right side and 2+ on the left side.      Patellar reflexes are 2+ on the right side and 2+ on the left side.      Achilles reflexes are 2+ on the right side and 2+ on the left side. Rapid alternating movement without difficulty. Stands on one foot without difficulty.  Psychiatric: She has a normal mood and affect. Her behavior is normal.    ED Course  Procedures (including critical care time) Labs Review Results for orders placed or performed during the hospital encounter of 05/18/14 (from the past 24 hour(s))  CBC with Differential     Status: None   Collection Time: 05/18/14  7:37 PM  Result Value Ref Range   WBC 7.1 4.0 - 10.5 K/uL   RBC 4.34 3.87 - 5.11 MIL/uL   Hemoglobin 12.4 12.0 - 15.0 g/dL   HCT 37.2 36.0 - 46.0 %   MCV 85.7 78.0 - 100.0 fL   MCH 28.6 26.0 - 34.0 pg   MCHC 33.3 30.0 - 36.0 g/dL   RDW  13.8 11.5 - 15.5 %   Platelets 299 150 - 400 K/uL   Neutrophils Relative % 60 43 - 77 %   Neutro Abs 4.2 1.7 - 7.7 K/uL   Lymphocytes Relative 31 12 - 46 %   Lymphs Abs 2.2 0.7 - 4.0 K/uL   Monocytes Relative 6 3 - 12 %   Monocytes Absolute 0.4 0.1 - 1.0 K/uL   Eosinophils Relative 3 0 - 5 %   Eosinophils Absolute 0.2 0.0 - 0.7 K/uL   Basophils Relative 0 0 - 1 %   Basophils Absolute 0.0 0.0 - 0.1 K/uL  Comprehensive metabolic panel     Status: Abnormal   Collection Time: 05/18/14  7:37 PM  Result Value Ref Range   Sodium 138 135 - 145 mmol/L   Potassium 3.8 3.5 - 5.1 mmol/L   Chloride 103 96 - 112 mmol/L   CO2 27 19 - 32 mmol/L   Glucose, Bld 106 (H) 70 - 99 mg/dL   BUN 8 6 - 23 mg/dL   Creatinine, Ser 0.61 0.50 - 1.10 mg/dL   Calcium 8.3 (L) 8.4 - 10.5 mg/dL   Total Protein 7.3 6.0 - 8.3 g/dL   Albumin 3.4 (L) 3.5 - 5.2 g/dL   AST 22 0 - 37 U/L   ALT 17 0 - 35 U/L   Alkaline Phosphatase 71 39 - 117 U/L   Total Bilirubin 0.4 0.3 - 1.2 mg/dL   GFR calc non Af Amer >90 >90 mL/min   GFR calc Af Amer >90 >90 mL/min   Anion gap 8 5 - 15     MDM  44 y.o. female with right side neck pain x 2 weeks without know injury. Stable for d/c without focal neuro deficits. Will treat for muscle spasm with Flexeril and NSAIDS. No facial weakness or decreased sensation. Patient denies any pain at this time.  Discussed with the patient clinical findings and plan of care and all questioned fully answered. She will return if any problems arise.   Final diagnoses:  Right torticollis      Ashley Murrain, NP 05/18/14 [place], MD 05/21/14 [phone]

## 2014-05-18 NOTE — ED Notes (Signed)
Patient states she has muscle spasms on the right side of the neck

## 2014-05-18 NOTE — ED Notes (Signed)
NP at bedside.

## 2014-07-01 ENCOUNTER — Ambulatory Visit (INDEPENDENT_AMBULATORY_CARE_PROVIDER_SITE_OTHER): Payer: Self-pay | Admitting: Emergency Medicine

## 2014-07-01 VITALS — BP 110/70 | HR 73 | Temp 97.9°F | Resp 17 | Ht 64.0 in | Wt 175.0 lb

## 2014-07-01 DIAGNOSIS — B353 Tinea pedis: Secondary | ICD-10-CM

## 2014-07-01 DIAGNOSIS — R5383 Other fatigue: Secondary | ICD-10-CM

## 2014-07-01 LAB — CBC
HEMATOCRIT: 37 % (ref 36.0–46.0)
Hemoglobin: 12.5 g/dL (ref 12.0–15.0)
MCH: 28.7 pg (ref 26.0–34.0)
MCHC: 33.8 g/dL (ref 30.0–36.0)
MCV: 85.1 fL (ref 78.0–100.0)
MPV: 9.5 fL (ref 8.6–12.4)
Platelets: 295 10*3/uL (ref 150–400)
RBC: 4.35 MIL/uL (ref 3.87–5.11)
RDW: 14.4 % (ref 11.5–15.5)
WBC: 7.3 10*3/uL (ref 4.0–10.5)

## 2014-07-01 LAB — COMPREHENSIVE METABOLIC PANEL
ALBUMIN: 3.7 g/dL (ref 3.5–5.2)
ALT: 23 U/L (ref 0–35)
AST: 18 U/L (ref 0–37)
Alkaline Phosphatase: 63 U/L (ref 39–117)
BILIRUBIN TOTAL: 0.4 mg/dL (ref 0.2–1.2)
BUN: 13 mg/dL (ref 6–23)
CHLORIDE: 104 meq/L (ref 96–112)
CO2: 24 mEq/L (ref 19–32)
CREATININE: 0.48 mg/dL — AB (ref 0.50–1.10)
Calcium: 8.6 mg/dL (ref 8.4–10.5)
GLUCOSE: 82 mg/dL (ref 70–99)
Potassium: 3.7 mEq/L (ref 3.5–5.3)
Sodium: 136 mEq/L (ref 135–145)
Total Protein: 7 g/dL (ref 6.0–8.3)

## 2014-07-01 LAB — TSH: TSH: 1.713 u[IU]/mL (ref 0.350–4.500)

## 2014-07-01 MED ORDER — TERBINAFINE HCL 250 MG PO TABS: 250.0000 mg | ORAL_TABLET | Freq: Every day | ORAL | Status: DC

## 2014-07-01 NOTE — Patient Instructions (Addendum)
Fatiga (Fatigue) Fatiga es la sensacin de cansancio, falta de energa, falta de motivacin, o sensacin permanente de Health and safety inspector. Si descansa lo suficiente, se alimenta bien y reduce las situaciones de estrs, Warehouse manager. Consulte con su mdico si esto persiste. La naturaleza de su fatiga indicar a su mdico cul es la causa. El tratamiento se implementa segn cul sea la causa.  CAUSAS Hay muchas causas de fatiga. La mayora de las veces puede hallarse en uno o ms de sus hbitos o rutinas. [place] causas de fatiga pueden incluirse en una o ms de tres reas generales: Ellas son: Problemas en el estilo de vida  Trastornos del sueo.  Trabajar demasiado.  Agotamiento fsico.  Hbitos no saludables.  Malos hbitos alimenticios o trastornos de alimentacin.  Uso de alcohol y/o droga.  Falta de nutricin adecuada (desnutricin). Problemas psiclogos  Problemas de estrs y/o ansiedad.  Depresin.  Duelo.  Aburrimiento. Trastornos o problemas mdicos  Anemia.  Embarazo.  Problemas en la glndula tiroides.  Recuperacin de [employer].  Dolores continuos.  Enfisema o asma no controladas adecuadamente.  Problemas alrgicos.  Diabetes.  Infecciones (como la mononucleosis).  Obesidad.  Trastornos del sueo, como apnea del sueo.  Insuficiencia cardiaca u otros problemas relacionados con el corazn.  Cncer.  Enfermedad del rin.  Enfermedad heptica.  Efectos de ciertos [employer] antihistamnicos, medicamentos para la tos y el resfro, analgsicos prescriptos, medicamentos para la hipertensin arterial y Film/video editor, medicamentos utilizados para el tratamiento de cncer y algunos antidepresivos. SNTOMAS Los sntomas de fatiga son:   Hollace Kinnier de Teacher, early years/pre.  Falta de motivacin.  Somnolencia.  Sensacin de indiferencia Psychologist, occupational. DIAGNSTICO Los detalles de cmo usted siente guan a su mdico para Sales promotion account executive. Le preguntar sobre su estado de salud presente y pasado. Esto es importante para revisar todos los medicamentos que usted toma, incluyendo los recetados y los no recetados. Le realizar un examen fsico exhaustivo. Le preguntar acerca de sus sentimientos, hbitos y estilo de vida normal. Su mdico puede indicar anlisis de [place], de [place] u otras pruebas para buscar las causas ms comunes de la fatiga.  TRATAMIENTO La fatiga se trata corrigiendo la causa subyacente. Por ejemplo, si usted tiene dolor continuo o depresin, el tratamiento de estas causas mejorar el problema. De mismo modo, al ajustar la dosis de ciertos medicamentos ayudar a Sports coach.  INSTRUCCIONES PARA EL CUIDADO DOMICILIARIO  Trate de dormir lo suficiente todas las noches.  Mantenga una dieta sana y [place], y beba suficiente agua Custar.  Practique modos de relajarse (como el yoga o la meditacin).  Haga ejercicios regularmente.  Haga planes para cambiar las situaciones que causan estrs. Haga esos planes de [employer] las tensiones disminuyan a lo largo del [place]. Mantenga su rutina personal y laboral dentro de lmites razonables.  Evite las drogas de la calle y minimice el consumo de alcohol.  Comience a tomar un multivitamnico diario tras consultar a su mdico. SOLICITE ATENCIN MDICA SI:  Sufre un cansancio persistente, que no puede describir.  Tiene fiebre.  Pierde peso de [place] involuntaria.  Sufre dolores de [place].  Sufre trastornos de [employer] noche.  Se siente triste.  Sufre constipacin.  Tiene la piel seca.  USAA.  Toma algn medicamento nuevo o diferente y sospecha que le causa fatiga.  No puede dormir por la noche.  Observa hinchazn inusual en sus piernas u otras partes del cuerpo. SOLICITE  ATENCIN MDICA SI:  Se siente confundido.  Su visin es borrosa.  Sufre mareos o se desmaya.  Sufre un dolor de cabeza intenso.  Sufre  un dolor abdominal, plvico o de espalda intensos.  Siente dolor en el pecho, le falta el aire o tiene un ritmo cardaco irregular o rpido.  No puede orinar normalmente.  Tiene una hemorragia anormal, como sangrado del recto o vomita sangre.  Tiene ideas de suicidio o de Marketing executive.  Est preocupado porque podra perjudicar a alguien ms. ASEGRESE QUE:   Comprende estas instrucciones.  Controlar su enfermedad.  Solicitar ayuda de inmediato si no mejora o si empeora. Document Released: 05/01/2008 Document Revised: 04/07/2011 [facility] Patient Information 2015 [place]. This information is not intended to replace advice given to you by your health care provider. Make sure you discuss any questions you have with your health care provider. Pie de atleta (Athlete's Foot) El pie de atleta (tinea pedis) es una infeccin por hongos en la piel de los pies. Generalmente aparece en la piel que se encuentra entre los dedos o debajo de los mismos. Tambin puede aparecer en la planta de los pies. El pie de atleta se produce con ms frecuencia cuando el clima es clido y hmedo. La falta de higiene en los pies o no cambiarse los calcetines con frecuencia puede contribuir a la aparicin del pie de atleta. La infeccin puede transmitirse de [place] persona a otra (escontagiosa).  CAUSAS La causa del pie de atleta es un hongo. Este hongo se desarrolla en lugares clidos y hmedos. [place] personas se contagian al compartir duchas, toallas y pisos mojados con una persona infectada. Las personas con el sistema inmunolgico dbil, incluidas la personas con diabetes, son ms propensas al pie de atleta. SNTOMAS  Picazn entre los dedos o en las plantas de los pies.  Aparecen zonas blanquecinas o escamosas entre los dedos o las plantas de los pies.  Pueden aparecer tambin pequeas ampollas que producen una picazn intensa.  Pequeos cortes en la piel. En estos cortes puede desarrollarse  una infeccin bacteriana.  Las uas de los pies pueden volverse ms finas o descoloridas. DIAGNSTICO El mdico puede diagnosticar el problema haciendo un examen fsico. Tambin podr tomar Truddie Coco de piel de la zona de la erupcin. La muestra de piel es examinada en el microscopio o puede realizarse una prueba para ver si se desarrollan hongos. Tambin podr tomarse una muestra de la ua para Physiological scientist.  TRATAMIENTO Para eliminar los hongos se utilizan medicamentos de venta libre o recetados. Estos medicamentos estn disponibles en forma de polvos o cremas. El mdico podr sugerirle medicamentos. Las infecciones por hongos responden lentamente al [employer]. Puede ser que necesite usar el medicamento durante varias semanas.  PREVENCIN   No comparta toallas.  Use sandalias cuando tenga que pisar zonas hmedas. como duchas y vestuarios compartidos.  Mantenga sus pies secos. Use zapatos que permitan la circulacin de aire. Use calcetines de algodn o lana. [place] todos los medicamentos segn le indic su mdico. No se aplique cremas con corticoides en el pie de atleta.  Mantenga los pies limpios y frescos. Lave sus pies todos los das y squelos cuidadosamente, [employer] dedos.  Cmbiese los calcetines [employer]. Use calcetines de algodn o lana. En climas clidos puede ser necesario cambiarse los calcetines 2  3 veces por da.  Use sandalias o zapatillas de lona que tengan buena ventilacin.  Si tiene  ampollas, remoje los pies en solucin de [place] o sales de [place] durante 20 a 30 minutos 2 veces por da para [employer]. Luego asegrese de [employer]. SOLICITE ATENCIN MDICA SI:  Jaclynn Guarneri.  Aumenta la hinchazn, el dolor, el calor o el enrojecimiento en el pie.  No mejora luego de 7 das de tratamiento.  No se cura completamente luego de 30 das.  Tiene problemas relacionados con los  medicamentos. EST SEGURO QUE:   Comprende las instrucciones para el alta mdica.  Controlar su enfermedad.  Solicitar atencin mdica de inmediato segn las indicaciones. Document Released: 01/13/2005 Document Revised: 04/07/2011 [facility] Patient Information 2015 [place]. This information is not intended to replace advice given to you by your health care provider. Make sure you discuss any questions you have with your health care provider.

## 2014-07-01 NOTE — Progress Notes (Signed)
Subjective:  Patient ID: Crystal [name], female    DOB: Aug 07, 1970  Age: 44 y.o. MRN: [id number]  CC: Fatigue; Hand Pain; Foot Swelling; and Rash   HPI Crystal [name] presents  with complaints. She said that the last 2 weeks her hands have felt "heavy" and that she has a swelling of her hands. She works in housekeeping and uses a Advice worker. His nitrile gloves when she works. She has no rash on arms or hands has no itching. She has no fever chills no cough coryza.  She also complains of a several year duration rash on both feet. Is somewhat pruritic. And is associated with scaling of the skin. She's had no improvement with over-the-counter medication.  Outpatient Prescriptions Prior to Visit  Medication Sig Dispense Refill  . cephALEXin (KEFLEX) 500 MG capsule Take 1 capsule (500 mg total) by mouth 4 (four) times daily. (Patient not taking: Reported on 12/21/2013) 28 capsule 0  . cyclobenzaprine (FLEXERIL) 5 MG tablet Take 1 tablet (5 mg total) by mouth 2 (two) times daily as needed for muscle spasms. (Patient not taking: Reported on 07/01/2014) 20 tablet 0  . naproxen (NAPROSYN) 500 MG tablet Take 1 tablet (500 mg total) by mouth 2 (two) times daily. (Patient not taking: Reported on 07/01/2014) 30 tablet 0   No facility-administered medications prior to visit.    History   Social History  . Marital Status: Single    Spouse Name: N/A  . Number of Children: N/A  . Years of Education: N/A   Social History Main Topics  . Smoking status: Never Smoker   . Smokeless tobacco: Not on file  . Alcohol Use: No  . Drug Use: No  . Sexual Activity: Yes    Birth Control/ Protection: Surgical   Other Topics Concern  . None   Social History Narrative   ** Merged History Encounter **        Family History  Problem Relation Age of Onset  . Hypertension Mother   . Diabetes Mother   . Heart disease Father   . Anesthesia problems Neg Hx     Past  Medical History  Diagnosis Date  . No pertinent past medical history   . Depression      Review of Systems  Constitutional: Positive for fatigue. Negative for fever, chills and appetite change.  HENT: Negative for congestion, ear pain, postnasal drip, sinus pressure and sore throat.   Eyes: Negative for pain and redness.  Respiratory: Negative for cough, shortness of breath and wheezing.   Cardiovascular: Negative for leg swelling.  Gastrointestinal: Negative for nausea, vomiting, abdominal pain, diarrhea, constipation and blood in stool.  Endocrine: Negative for polyuria.  Genitourinary: Negative for dysuria, urgency, frequency and flank pain.  Musculoskeletal: Negative for gait problem.  Skin: Positive for rash.  Neurological: Negative for weakness and headaches.  Psychiatric/Behavioral: Negative for confusion and decreased concentration. The patient is not nervous/anxious.     Objective:  BP 110/70 mmHg  Pulse 73  Temp(Src) 97.9 F (36.6 C) (Oral)  Resp 17  Ht 5\' 4"  (1.626 m)  Wt 175 lb (79.379 kg)  BMI 30.02 kg/m2  SpO2 99%  LMP 05/29/2014  BP Readings from Last 3 Encounters:  07/01/14 110/70  05/18/14 104/75  10/24/13 119/76    Wt Readings from Last 3 Encounters:  07/01/14 175 lb (79.379 kg)  05/18/14 175 lb (79.379 kg)  06/24/13 166 lb 6.4 oz (75.479 kg)    Physical Exam  Constitutional: She is oriented to person, place, and time. She appears well-developed and well-nourished. No distress.  HENT:  Head: Normocephalic and atraumatic.  Right Ear: External ear normal.  Left Ear: External ear normal.  Nose: Nose normal.  Eyes: Conjunctivae and EOM are normal. Pupils are equal, round, and reactive to light. No scleral icterus.  Neck: Normal range of motion. Neck supple. No tracheal deviation present.  Cardiovascular: Normal rate, regular rhythm and normal heart sounds.   Pulmonary/Chest: Effort normal. No respiratory distress. She has no wheezes. She has no  rales.  Abdominal: She exhibits no mass. There is no tenderness. There is no rebound and no guarding.  Musculoskeletal: She exhibits no edema.  Lymphadenopathy:    She has no cervical adenopathy.  Neurological: She is alert and oriented to person, place, and time. Coordination normal.  Skin: Skin is warm and dry. Rash noted.  Psychiatric: She has a normal mood and affect. Her behavior is normal.    Lab Results  Component Value Date   WBC 7.1 05/18/2014   HGB 12.4 05/18/2014   HCT 37.2 05/18/2014   PLT 299 05/18/2014   GLUCOSE 106* 05/18/2014   CHOL 153 06/24/2013   TRIG 75 06/24/2013   HDL 52 06/24/2013   LDLCALC 86 06/24/2013   ALT 17 05/18/2014   AST 22 05/18/2014   NA 138 05/18/2014   K 3.8 05/18/2014   CL 103 05/18/2014   CREATININE 0.61 05/18/2014   BUN 8 05/18/2014   CO2 27 05/18/2014   INR 1.10 03/03/2009   HGBA1C 5.6 06/24/2013      .  Assessment & Plan:   Crystal was seen today for fatigue, hand pain, foot swelling and rash.  Diagnoses and all orders for this visit:  Tinea pedis of both feet  Other fatigue Orders: -     CBC -     Comprehensive metabolic panel -     TSH  Other orders -     terbinafine (LAMISIL) 250 MG tablet; Take 1 tablet (250 mg total) by mouth daily.   I am having Ms. [name] start on terbinafine. I am also having her maintain her cephALEXin, cyclobenzaprine, and naproxen.  Meds ordered this encounter  Medications  . terbinafine (LAMISIL) 250 MG tablet    Sig: Take 1 tablet (250 mg total) by mouth daily.    Dispense:  15 tablet    Refill:  0    Appropriate red flag conditions were discussed with the patient as well as actions that should be taken.  Patient expressed his understanding.  Follow-up: Return if symptoms worsen or fail to improve.  Roselee Culver, MD

## 2014-12-04 ENCOUNTER — Other Ambulatory Visit (HOSPITAL_COMMUNITY): Payer: Self-pay | Admitting: *Deleted

## 2014-12-04 DIAGNOSIS — N644 Mastodynia: Secondary | ICD-10-CM

## 2014-12-29 ENCOUNTER — Ambulatory Visit: Payer: No Typology Code available for payment source

## 2014-12-29 ENCOUNTER — Ambulatory Visit (HOSPITAL_COMMUNITY)
Admission: RE | Admit: 2014-12-29 | Discharge: 2014-12-29 | Disposition: A | Discharge status: Home / Self Care | Payer: No Typology Code available for payment source | Source: Ambulatory Visit | Attending: Obstetrics and Gynecology | Admitting: Obstetrics and Gynecology

## 2014-12-29 ENCOUNTER — Encounter (HOSPITAL_COMMUNITY): Payer: Self-pay

## 2014-12-29 VITALS — BP 110/64 | Temp 97.7°F | Wt 167.0 lb

## 2014-12-29 DIAGNOSIS — Z1239 Encounter for other screening for malignant neoplasm of breast: Secondary | ICD-10-CM

## 2014-12-29 DIAGNOSIS — N644 Mastodynia: Secondary | ICD-10-CM

## 2014-12-29 NOTE — Addendum Note (Signed)
Encounter addended by: Loletta Parish, RN on: 12/29/2014  1:33 PM<BR>     Documentation filed: Notes Section

## 2014-12-29 NOTE — Patient Instructions (Addendum)
Educational materials on self breast awareness. Explained to Crystal [name] that she did not need a Pap smear today due to last Pap smear was 10/16/2011. Let her know [facility] will cover Pap smears and HPV typing every 5 years unless has a history of abnormal Pap smears. Referred patient to the [facility] for diagnostic mammogram. Appointment scheduled for Friday, December 29, 2014 at 1110. Patient aware of appointment and will be there. [name] verbalized understanding.  Terik Haughey, Arvil Chaco, RN 10:14 AM

## 2014-12-29 NOTE — Addendum Note (Signed)
Encounter addended by: Loletta Parish, RN on: 12/29/2014  1:36 PM<BR>     Documentation filed: Notes Section

## 2014-12-29 NOTE — Progress Notes (Addendum)
Complaints of left breast pain that comes and goes. Patient rates pain at a 4-5 out of 10.  Pap Smear:  Pap smear not completed today. Last Pap smear was 10/16/2011 at the [facility] and normal with negative HPV. Per patient has no history of an abnormal Pap smear. Last Pap smear result is in EPIC.  Physical exam: Breasts Breasts symmetrical. No skin abnormalities bilateral breasts. No nipple retraction bilateral breasts. No nipple discharge bilateral breasts. No lymphadenopathy. No lumps palpated bilateral breasts. No complaints of pain or tenderness on exam. Referred patient to the [facility] for diagnostic mammogram. Appointment scheduled for Friday, December 29, 2014 at 1110.  Pelvic/Bimanual No Pap smear completed today since last Pap smear was 10/16/2011. Pap smear not indicated per BCCCP guidelines.   Used interpreter Ma Coy Saunas from [facility].

## 2014-12-29 NOTE — Addendum Note (Signed)
Encounter addended by: Loletta Parish, RN on: 12/29/2014 12:57 PM<BR>     Documentation filed: Notes Section

## 2015-01-08 ENCOUNTER — Telehealth (HOSPITAL_COMMUNITY): Payer: Self-pay | Admitting: *Deleted

## 2015-01-08 NOTE — Telephone Encounter (Signed)
Called patient with interpreter Benjamine Sprague to remind patient to reschedule diagnostic mammogram appointment at the [facility]. Phone number given for patient to call and reschedule. Patient verbalized understanding.

## 2015-02-07 ENCOUNTER — Telehealth (HOSPITAL_COMMUNITY): Payer: Self-pay | Admitting: *Deleted

## 2015-02-07 NOTE — Telephone Encounter (Signed)
Telephoned patient at home # and advised patient of scheduled appointment at [facility] Jan 18 10:50. Used interpreter [employer]. Patient voiced understanding.

## 2015-02-14 ENCOUNTER — Ambulatory Visit
Admission: RE | Admit: 2015-02-14 | Discharge: 2015-02-14 | Disposition: A | Discharge status: Home / Self Care | Payer: No Typology Code available for payment source | Source: Ambulatory Visit | Attending: Obstetrics and Gynecology | Admitting: Obstetrics and Gynecology

## 2015-02-14 ENCOUNTER — Other Ambulatory Visit: Payer: No Typology Code available for payment source

## 2015-02-14 DIAGNOSIS — N644 Mastodynia: Secondary | ICD-10-CM

## 2015-03-22 ENCOUNTER — Ambulatory Visit: Payer: Self-pay

## 2015-03-22 ENCOUNTER — Other Ambulatory Visit: Payer: Self-pay

## 2015-03-22 ENCOUNTER — Ambulatory Visit: Payer: No Typology Code available for payment source

## 2015-03-22 VITALS — BP 110/78 | HR 64 | Temp 98.3°F | Resp 18 | Ht 63.0 in | Wt 170.9 lb

## 2015-03-22 DIAGNOSIS — Z0000 Encounter for general adult medical examination without abnormal findings: Secondary | ICD-10-CM

## 2015-03-22 LAB — LIPID PANEL
CHOL/HDL RATIO: 3.2 ratio (ref 0.0–4.4)
CHOLESTEROL TOTAL: 156 mg/dL (ref 100–199)
HDL: 49 mg/dL (ref >39)
LDL CALC: 85 mg/dL (ref 0–99)
Triglycerides: 112 mg/dL (ref 0–149)
VLDL CHOLESTEROL CAL: 22 mg/dL (ref 5–40)

## 2015-03-22 NOTE — Progress Notes (Signed)
Patient is a new patient to the [facility] program and is currently a [facility] patient effective 12/29/2014 with interpreter Lavon Paganini.   Clinical Measurements: Patient is 5 ft. 3 inches, weight 170.9 lbs, and BMI 30.3.   Medical History: Patient stated last time went to doctor was told cholesterol was high but not on medication. Patient does not have a history of hypertension or diabetes. Per patient no diagnosed history of coronary heart disease, heart attack, heart failure, stroke/TIA, vascular disease or congenital heart defects.   Blood Pressure, Self-measurement: Patient states has no reason to check Blood pressure.  Nutrition Assessment: Patient stated that eats 2 fruits every day. Patient states she eats 1 serving of vegetables a day. Per patient states does eat 3 or more ounces of whole grains daily. Patient stated doesn't eat two or more servings of fish weekly. Patient states she does not  drink more than 36 ounces or 450 calories of beverages with added sugars weekly. Patient stated she does watch her salt intake. 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 Patient does around 300 minutes of moderate exercise a week and rarely does any vigorous exercise.  Smoking Status: Patient has never smoked and is not exposed to smoke.   Quality of Life Assessment: In assessing patient's quality of life she stated that out of the past 30 days that she has felt her health was not good for one day. Patient also stated that in the past 30 days that her mental health was not good including stress, depression and problems with emotions for 2 to 3 days. Patient did state that out of the past 30 days she felt her physical or mental health had not kept her from doing her usual activities including self-care, work or recreation.   Plan: Lab work will be done today including a lipid panel and Hgb A1C. Will call lab results when they are finished. Will discuss risk  reduction health coaching when call results.

## 2015-03-22 NOTE — Patient Instructions (Signed)
Discussed health assessment with patient.She will be called with results of lab work and we will then discussed any further follow up the patient needs. Patient verbalized understanding. 

## 2015-03-23 LAB — HEMOGLOBIN A1C
Est. average glucose Bld gHb Est-mCnc: 117 mg/dL
HEMOGLOBIN A1C: 5.7 % — AB (ref 4.8–5.6)

## 2015-03-26 ENCOUNTER — Telehealth: Payer: Self-pay

## 2015-03-26 NOTE — Telephone Encounter (Signed)
Called and informed about [facility] lab results. Left message by Lavon Paganini interpreter to return call.Marland Kitchen

## 2015-03-28 ENCOUNTER — Telehealth: Payer: Self-pay

## 2015-03-28 NOTE — Telephone Encounter (Signed)
LAB RESULTS: Called to inform about lab work from 03/22/15. Interpreter, Anastasio Auerbach informed patient: BMI 30.3 (obese), cholesterol- 156, HDL- 49, LDL- 85, triglycerides - 112, and HBG-A1C - 5.7.    RISK REDUCTION HEALTH COACHING: Did risk reduction health coaching concerning BMI. Informed that normal BMI was 18 to 25 and patient is in obese range. Discussed portion sizes and decreasing the amount of starches and carbohydrates she eats. Informed patient needs to stay away from sweet drinks and food that have sugar. Asked patient if wanted more information and to do in person and stated she did. Health Coaching scheduled for Tuesday, March 14th at 3 PM..

## 2015-04-10 ENCOUNTER — Ambulatory Visit: Payer: Self-pay

## 2015-04-10 VITALS — Ht 63.0 in | Wt 169.5 lb

## 2015-04-10 DIAGNOSIS — Z789 Other specified health status: Secondary | ICD-10-CM

## 2015-04-10 NOTE — Progress Notes (Signed)
[place] Patient returns today for Health Coaching without interpreter.Patients daughter was with her. Patient coming mainly because did not know how to get started.Marland Kitchen    HEALTH COACHING:  Patient and I went over lab results and looked at normal range. Per patient her main problem is that she loves bread and torilla's. Patient received handouts in spanish with the first talking about portion sizes for each food group. We then looked at 1400 to 1600 calorie diet and how many portions can have in each food group. Discussed different thing could have that were low calorie for snacks. Wrote out a menu to look at number of serving that was eating of each food group.Discussed with patient to start slowly and make a change every week. Patient received handouts and were reviewed on portions, fiber, food groups and reading labels. We went over using our Plate method and patient received plate with portions marked and measuring cup. Patient received bag to carry all materials home.   Discussed the need to get in some activity as the weather warms up like walking.Patient stated that would try. PLAN: Will Call in 3 to 4 weeks. Will slowly decrease portion sizes per starch group and exercise.

## 2015-04-10 NOTE — Patient Instructions (Signed)
Will measure food portions or use plate method. Will use artifical sweetener. Will start walking everyday.

## 2015-06-15 ENCOUNTER — Telehealth: Payer: Self-pay

## 2015-06-15 NOTE — Telephone Encounter (Signed)
Called for health coaching for [facility] and voice mail was full. Will call again.

## 2016-02-15 ENCOUNTER — Other Ambulatory Visit: Payer: Self-pay | Admitting: Obstetrics and Gynecology

## 2016-02-15 DIAGNOSIS — Z1231 Encounter for screening mammogram for malignant neoplasm of breast: Secondary | ICD-10-CM

## 2016-02-28 ENCOUNTER — Encounter (HOSPITAL_COMMUNITY): Payer: Self-pay

## 2016-02-28 ENCOUNTER — Ambulatory Visit
Admission: RE | Admit: 2016-02-28 | Discharge: 2016-02-28 | Disposition: A | Discharge status: Home / Self Care | Payer: No Typology Code available for payment source | Source: Ambulatory Visit | Attending: Obstetrics and Gynecology | Admitting: Obstetrics and Gynecology

## 2016-02-28 ENCOUNTER — Ambulatory Visit (HOSPITAL_COMMUNITY)
Admission: RE | Admit: 2016-02-28 | Discharge: 2016-02-28 | Disposition: A | Discharge status: Home / Self Care | Payer: Self-pay | Source: Ambulatory Visit | Attending: Obstetrics and Gynecology | Admitting: Obstetrics and Gynecology

## 2016-02-28 VITALS — BP 110/72 | Temp 98.1°F | Ht 63.0 in | Wt 163.6 lb

## 2016-02-28 DIAGNOSIS — Z1239 Encounter for other screening for malignant neoplasm of breast: Secondary | ICD-10-CM

## 2016-02-28 DIAGNOSIS — Z1231 Encounter for screening mammogram for malignant neoplasm of breast: Secondary | ICD-10-CM

## 2016-02-28 NOTE — Patient Instructions (Addendum)
Explained breast self awareness to Crystal [name]. Patient did not need a Pap smear today due to last Pap smear and HPV typing was 10/16/2011. Let her know [facility] will cover Pap smears and HPV typing every 5 years unless has a history of abnormal Pap smears. Reminded patient that her next Pap smear is due in September. Told patient to call Gabriel Cirri to schedule an appointment with [facility] a couple months prior to when Pap smear is due. Referred patient to the [facility] for a screening mammogram. Appointment scheduled for Thursday, February 28, 2016 at [place]. Let patient know the [facility] will follow up with her within the next couple weeks with results of mammogram by letter or phone. Crystal [name] verbalized understanding.  Tian Davison, Arvil Chaco, RN 9:39 AM

## 2016-02-28 NOTE — Progress Notes (Signed)
No complaints today.   Pap Smear: Pap smear not completed today. Last Pap smear was 10/16/2011 at the [facility] and normal with negative HPV. Per patient has no history of an abnormal Pap smear. Last Pap smear result is in EPIC.  Physical exam: Breasts Breasts symmetrical. No skin abnormalities bilateral breasts. No nipple retraction bilateral breasts. No nipple discharge bilateral breasts. No lymphadenopathy. No lumps palpated bilateral breasts. No complaints of pain or tenderness on exam. Referred patient to the [facility] for a screening mammogram. Appointment scheduled for Thursday, February 28, 2016 at [place].        Pelvic/Bimanual No Pap smear completed today since last Pap smear and HPV typing was 10/16/2011. Pap smear not indicated per B[facility]CP guidelines.    Smoking History: Patient has never smoked.  Patient Navigation: Patient education provided. Access to services provided for patient through [facility] program. Spanish interpreter provided.  Used Spanish interpreter [employer] from [place].

## 2016-02-29 ENCOUNTER — Encounter (HOSPITAL_COMMUNITY): Payer: Self-pay | Admitting: *Deleted

## 2016-10-23 ENCOUNTER — Encounter (HOSPITAL_COMMUNITY): Payer: Self-pay

## 2016-10-23 ENCOUNTER — Encounter (HOSPITAL_COMMUNITY): Payer: Self-pay | Admitting: *Deleted

## 2016-10-23 ENCOUNTER — Ambulatory Visit (HOSPITAL_COMMUNITY)
Admission: RE | Admit: 2016-10-23 | Discharge: 2016-10-23 | Disposition: A | Discharge status: Home / Self Care | Payer: No Typology Code available for payment source | Source: Ambulatory Visit | Attending: Obstetrics and Gynecology | Admitting: Obstetrics and Gynecology

## 2016-10-23 VITALS — BP 106/78 | Ht 63.0 in | Wt 175.0 lb

## 2016-10-23 DIAGNOSIS — N898 Other specified noninflammatory disorders of vagina: Secondary | ICD-10-CM

## 2016-10-23 DIAGNOSIS — Z01419 Encounter for gynecological examination (general) (routine) without abnormal findings: Secondary | ICD-10-CM

## 2016-10-23 NOTE — Progress Notes (Signed)
Pap smear completed

## 2016-10-23 NOTE — Addendum Note (Signed)
Encounter addended by: Loletta Parish, RN on: 10/23/2016 12:19 PM<BR>    Actions taken: Visit diagnoses modified

## 2016-10-23 NOTE — Progress Notes (Signed)
No complaints today.   Pap Smear: Pap smear completed today. Last Pap smear was 10/16/2011 at the [facility] and normal with negative HPV. Per patient has no history of an abnormal Pap smear. Last Pap smear result is in EPIC.  Pelvic/Bimanual   Ext Genitalia No lesions, no swelling and no discharge observed on external genitalia.         Vagina Vagina pink and normal texture. No lesions and yellow colored discharge observed in vagina. Wet prep completed.          Cervix Cervix is present. Cervix pink and of normal texture. Yellow colored discharge observed on cervix.    Uterus Uterus is present and palpable. Uterus in normal position and normal size.        Adnexae Bilateral ovaries present and palpable. No tenderness on palpation.          Rectovaginal No rectal exam completed today since patient had no rectal complaints. No skin abnormalities observed on exam.    Smoking History: Patient has never smoked.  Patient Navigation: Patient education provided. Access to services provided for patient through [facility] program. Spanish interpreter provided.  Used Spanish interpreter [employer] from [place].

## 2016-10-23 NOTE — Patient Instructions (Signed)
Explained to Crystal [name] that BCCCP will cover Pap smears and HPV typing every 5 years unless has a history of abnormal Pap smears. Informed patient that her next screening mammogram is due in February 2019. Crystal [name] verbalized understanding.  Runa Whittingham, Arvil Chaco, RN 11:29 AM

## 2016-10-27 ENCOUNTER — Other Ambulatory Visit (HOSPITAL_COMMUNITY): Payer: Self-pay | Admitting: *Deleted

## 2016-10-27 DIAGNOSIS — N760 Acute vaginitis: Principal | ICD-10-CM

## 2016-10-27 DIAGNOSIS — B9689 Other specified bacterial agents as the cause of diseases classified elsewhere: Secondary | ICD-10-CM

## 2016-10-27 LAB — CYTOLOGY - PAP
BACTERIAL VAGINITIS: POSITIVE — AB
Candida vaginitis: NEGATIVE
Diagnosis: NEGATIVE
HPV: NOT DETECTED
TRICH (WINDOWPATH): NEGATIVE

## 2016-10-27 MED ORDER — METRONIDAZOLE 500 MG PO TABS: 500.0000 mg | ORAL_TABLET | Freq: Two times a day (BID) | ORAL | 0 refills | Status: DC

## 2016-10-27 NOTE — Progress Notes (Signed)
Telephoned patient at home number and advised of negative pap smear results. HPV was negative.  Next pa smear due in 5 years. Pap smear did show bacterial vaginosis and medication was called into pharmacy. Advised patient to finish all medication and no alcohol while taking medication. Patient voiced understanding. Used interpreter Lavon Paganini.

## 2016-10-28 ENCOUNTER — Telehealth (HOSPITAL_COMMUNITY): Payer: Self-pay | Admitting: *Deleted

## 2016-10-28 NOTE — Telephone Encounter (Signed)
Telephoned patient at home number and advised patient of negative pap smear results. HPV was negative. Next pap smear due in five years. Advised patient pap smear did show bacterial vaginosis and medication was called into pharmacy. Advised patient to finish all medication and no alcohol while taking. Patient voiced understanding. Used interpreter Lavon Paganini.

## 2016-11-10 ENCOUNTER — Encounter (HOSPITAL_COMMUNITY): Payer: Self-pay

## 2016-12-12 ENCOUNTER — Encounter (HOSPITAL_COMMUNITY): Payer: Self-pay

## 2017-02-03 ENCOUNTER — Inpatient Hospital Stay (HOSPITAL_COMMUNITY)
Admission: AD | Admit: 2017-02-03 | Discharge: 2017-02-03 | Discharge status: Against Medical Advice | Payer: No Typology Code available for payment source | Source: Ambulatory Visit | Attending: Obstetrics and Gynecology | Admitting: Obstetrics and Gynecology

## 2017-02-03 ENCOUNTER — Encounter (HOSPITAL_COMMUNITY): Payer: Self-pay | Admitting: Student

## 2017-02-03 NOTE — Progress Notes (Signed)
Pt called 3rd time to be evaluated in triage, no response.  Pt left prior to being seen.

## 2017-02-03 NOTE — Progress Notes (Signed)
Pt called to be seen in triage, not in waiting area.  Will call again in 10-15 minutes

## 2017-02-03 NOTE — Progress Notes (Signed)
Pt called 2nd time to be seen in triage for eval, no response.  Will call again in 10-15 minutes.

## 2017-02-25 ENCOUNTER — Encounter: Payer: Self-pay | Admitting: Obstetrics & Gynecology

## 2017-02-25 ENCOUNTER — Ambulatory Visit (INDEPENDENT_AMBULATORY_CARE_PROVIDER_SITE_OTHER): Payer: Self-pay | Admitting: Obstetrics & Gynecology

## 2017-02-25 VITALS — BP 106/66 | HR 72 | Ht 61.0 in | Wt 178.9 lb

## 2017-02-25 DIAGNOSIS — R102 Pelvic and perineal pain: Secondary | ICD-10-CM

## 2017-02-25 NOTE — Progress Notes (Signed)
[employer] interpreter [phone]

## 2017-02-25 NOTE — Progress Notes (Signed)
   GYNECOLOGY OFFICE VISIT NOTE  History:  47 y.o. [id number] here today for evaluation of chronic LLQ pain x 8 years. Patient is Spanish-speaking only, Spanish interpreter present for this encounter.  Accompanied by her daughters.  Has been evaluated for this same complaint with multiple imaging studies, no anomaly seen. Reports radiation of episodic pain to left hip and down left leg.  She denies any abnormal vaginal discharge, bleeding, other pelvic pain or other concerns.  She desires repeat ultrasound to check her ovaries.   Past Medical History:  Diagnosis Date  . Depression     Past Surgical History:  Procedure Laterality Date  . CHOLECYSTECTOMY N/A 07/28/2012   Procedure: LAPAROSCOPIC CHOLECYSTECTOMY WITH INTRAOPERATIVE CHOLANGIOGRAM;  Surgeon: Imogene Burn. Tsuei, MD;  Location: [facility];  Service: General;  Laterality: N/A;  . TUBAL LIGATION     7 years ago    The following portions of the patient's history were reviewed and updated as appropriate: allergies, current medications, past family history, past medical history, past social history, past surgical history and problem list.   Health Maintenance:  Normal pap and negative HRHPV on 10/23/2016.  Normal mammogram on 02/2016.   Review of Systems:  Pertinent items noted in HPI and remainder of comprehensive ROS otherwise negative.  Objective:  Physical Exam BP 106/66   Pulse 72   Ht 5\' 1"  (1.549 m)   Wt 178 lb 14.4 oz (81.1 kg)   LMP 01/10/2017 (Exact Date)   BMI 33.80 kg/m  CONSTITUTIONAL: Well-developed, well-nourished female in no acute distress.  HENT:  Normocephalic, atraumatic. External right and left ear normal. Oropharynx is clear and moist EYES: Conjunctivae and EOM are normal. Pupils are equal, round, and reactive to light. No scleral icterus.  NECK: Normal range of motion, supple, no masses SKIN: Skin is warm and dry. No rash noted. Not diaphoretic. No erythema. No pallor. NEUROLOGIC: Alert and oriented to person,  place, and time. Normal reflexes, muscle tone coordination. No cranial nerve deficit noted. PSYCHIATRIC: Normal mood and affect. Normal behavior. Normal judgment and thought content. CARDIOVASCULAR: Normal heart rate noted RESPIRATORY: Effort and breath sounds normal, no problems with respiration noted ABDOMEN: Soft, no distention noted. Mild LLQ tenderness to palpation.   PELVIC: Deferred MUSCULOSKELETAL: Normal range of motion. No edema noted.  Labs and Imaging No results found.  Assessment & Plan:  1. Pelvic pain in female Patient told that pain is likely musculoskeletal or nerve related given radiation. She still desires ultrasound. Recommended NSAIDs prn pain. Ultrasound ordered, will follow up results and manage accordingly. - US PELVIC COMPLETE WITH TRANSVAGINAL; Future Routine preventative health maintenance measures emphasized. Please refer to After Visit Summary for other counseling recommendations.   Return if symptoms worsen or fail to improve.   Total face-to-face time with patient: 20 minutes.  Over 50% of encounter was spent on counseling and coordination of care.   Verita Schneiders, MD, [place] for [employer], [place]

## 2017-02-25 NOTE — Patient Instructions (Signed)
Dolor plvico en la mujer (Pelvic Pain, Female) El dolor plvico se percibe en la parte baja del abdomen, por debajo del ombligo y entre las caderas. El dolor puede comenzar de forma repentina (agudo), reaparecer (recurrente) o durar mucho tiempo (crnico). Se considera que el dolor plvico que dura ms de seis meses es crnico. El dolor plvico puede afectar lo siguiente:  Los rganos genitales.  El sistema urinario.  El tubo digestivo.  El sistema musculoesqueltico. El dolor plvico tiene muchas causas posibles. A veces, puede ser el resultado de afecciones digestivas o urinarias, distensiones de msculos o ligamentos, o incluso trastornos genitales. A veces, la causa no se conoce. INSTRUCCIONES PARA EL CUIDADO EN EL HOGAR  Tome los medicamentos de venta libre y los recetados solamente como se lo haya indicado el mdico.  Haga reposo como se lo haya indicado el mdico.  No tenga relaciones sexuales si le causan dolor.  Lleve un registro del dolor plvico. Escriba los siguientes datos: ? Cundo comenz el dolor. ? La ubicacin del dolor. ? Qu cosas parecen aliviar o intensificar el dolor, como los alimentos o la menstruacin. ? Los sntomas que tiene junto con el dolor.  Concurra a todas las visitas de control como se lo haya indicado el mdico. Esto es importante. SOLICITE ATENCIN MDICA SI:  Los medicamentos no le alivian el dolor.  El dolor reaparece.  Aparecen nuevos sntomas.  Tiene secrecin o sangrado vaginal anormal, incluido sangrado despus de la menopausia.  Tiene fiebre o siente escalofros.  Tiene estreimiento.  Observa sangre en la orina o en la materia fecal.  La orina tiene mal olor.  Se siente mareada o dbil. SOLICITE ATENCIN MDICA DE INMEDIATO SI:  Siente dolor intenso repentinamente.  El dolor es cada vez ms intenso.  Tiene dolor intenso junto con fiebre, nuseas, vmitos o exceso de sudoracin.  Pierde la conciencia. Esta  informacin no tiene como fin reemplazar el consejo del mdico. Asegrese de hacerle al mdico cualquier pregunta que tenga. Document Released: 04/11/2008 Document Revised: 05/30/2014 Document Reviewed: 11/03/2014 Elsevier Interactive Patient Education  2018 Elsevier Inc.  

## 2017-03-02 ENCOUNTER — Ambulatory Visit (HOSPITAL_COMMUNITY)
Admission: RE | Admit: 2017-03-02 | Discharge: 2017-03-02 | Disposition: A | Discharge status: Home / Self Care | Payer: Self-pay | Source: Ambulatory Visit | Attending: Obstetrics & Gynecology | Admitting: Obstetrics & Gynecology

## 2017-03-02 DIAGNOSIS — R9389 Abnormal findings on diagnostic imaging of other specified body structures: Secondary | ICD-10-CM | POA: Insufficient documentation

## 2017-03-02 DIAGNOSIS — R102 Pelvic and perineal pain: Secondary | ICD-10-CM | POA: Insufficient documentation

## 2017-03-16 ENCOUNTER — Other Ambulatory Visit: Payer: Self-pay | Admitting: Obstetrics and Gynecology

## 2017-03-16 DIAGNOSIS — Z1231 Encounter for screening mammogram for malignant neoplasm of breast: Secondary | ICD-10-CM

## 2017-03-20 ENCOUNTER — Telehealth: Payer: Self-pay | Admitting: *Deleted

## 2017-03-20 NOTE — Telephone Encounter (Signed)
A man called on behalf of patient and left message that she is wanting her results. She can be reached at [phone].

## 2017-03-20 NOTE — Telephone Encounter (Signed)
Called patient and left message that we are returning her call.

## 2017-03-25 NOTE — Telephone Encounter (Signed)
Called patient with [employer] interpreter [phone] and informed patient of ultrasound results and explained what fibroids are to patient. Patient verbalized understanding & had no questions

## 2017-04-02 ENCOUNTER — Ambulatory Visit (HOSPITAL_COMMUNITY): Payer: Self-pay

## 2017-05-28 ENCOUNTER — Ambulatory Visit: Payer: Self-pay

## 2017-05-28 ENCOUNTER — Ambulatory Visit (HOSPITAL_COMMUNITY): Payer: No Typology Code available for payment source | Attending: Obstetrics and Gynecology

## 2017-08-06 ENCOUNTER — Ambulatory Visit (HOSPITAL_COMMUNITY): Payer: No Typology Code available for payment source

## 2017-09-04 ENCOUNTER — Other Ambulatory Visit (HOSPITAL_COMMUNITY): Payer: Self-pay | Admitting: *Deleted

## 2017-09-04 DIAGNOSIS — N6320 Unspecified lump in the left breast, unspecified quadrant: Secondary | ICD-10-CM

## 2017-10-27 ENCOUNTER — Ambulatory Visit (HOSPITAL_COMMUNITY): Payer: No Typology Code available for payment source

## 2017-10-27 ENCOUNTER — Other Ambulatory Visit: Payer: No Typology Code available for payment source

## 2018-02-03 ENCOUNTER — Other Ambulatory Visit (HOSPITAL_COMMUNITY): Payer: Self-pay | Admitting: *Deleted

## 2018-02-03 DIAGNOSIS — N6320 Unspecified lump in the left breast, unspecified quadrant: Secondary | ICD-10-CM

## 2018-02-18 ENCOUNTER — Ambulatory Visit (HOSPITAL_COMMUNITY)
Admission: RE | Admit: 2018-02-18 | Discharge: 2018-02-18 | Disposition: A | Discharge status: Home / Self Care | Payer: Self-pay | Source: Ambulatory Visit | Attending: Obstetrics and Gynecology | Admitting: Obstetrics and Gynecology

## 2018-02-18 ENCOUNTER — Encounter (HOSPITAL_COMMUNITY): Payer: Self-pay

## 2018-02-18 VITALS — BP 124/76 | Ht 65.0 in | Wt 192.0 lb

## 2018-02-18 DIAGNOSIS — N6321 Unspecified lump in the left breast, upper outer quadrant: Secondary | ICD-10-CM

## 2018-02-18 DIAGNOSIS — N644 Mastodynia: Secondary | ICD-10-CM

## 2018-02-18 DIAGNOSIS — Z1239 Encounter for other screening for malignant neoplasm of breast: Secondary | ICD-10-CM

## 2018-02-18 NOTE — Patient Instructions (Signed)
Explained breast self awareness with [name]. Patient did not need a Pap smear today due to last Pap smear and HPV typing was 10/23/2016. Let her know BCCCP will cover Pap smears and HPV typing every 5 years unless has a history of abnormal Pap smears. Referred patient to the [facility] for a diagnostic mammogram and left breast ultrasound. Appointment scheduled for Friday, February 19, 2018 at 0920. Patient aware of appointment and will be there. [name] verbalized understanding.  Diquan Kassis, Arvil Chaco, RN 4:01 PM

## 2018-02-18 NOTE — Progress Notes (Signed)
Complaints of left outer breast pain and lump x 3-4 months. Patient states the pain comes and goes. Patient rates the pain at a 5 out of 10.  Pap Smear: Pap smear not completed today. Last Pap smear was 10/23/2016 at [facility] and normal with negative HPV. Per patient has no history of an abnormal Pap smear. Last Pap smear result is in Epic.  Physical exam: Breasts Breasts symmetrical. No skin abnormalities bilateral breasts. No nipple retraction bilateral breasts. No nipple discharge bilateral breasts. No lymphadenopathy. No lumps palpated right breast. Palpated a pea sized lump within the left breast at 2 o'clock 7 cm from the nipple. Complaints of tenderness when palpated the left breast lump. Referred patient to the [facility] for a diagnostic mammogram and left breast ultrasound. Appointment scheduled for Friday, February 19, 2018 at 0920.      Pelvic/Bimanual No Pap smear completed today since last Pap smear and HPV typing was 10/23/2016. Pap smear not indicated per BCCCP guidelines.   Smoking History: Patient has never smoked.  Patient Navigation: Patient education provided. Access to services provided for patient through [facility] program. Spanish interpreter provided.   Breast and Cervical Cancer Risk Assessment: Patient has no family history of breast cancer, known genetic mutations, or radiation treatment to the chest before age 43. Patient has no history of cervical dysplasia, immunocompromised, or DES exposure in-utero.  Risk Assessment    Risk Scores      02/18/2018   Last edited by: Loletta Parish, RN   5-year risk: 0.6 %   Lifetime risk: 5.9 %         Used Spanish interpreter Rudene Anda from [place].

## 2018-02-19 ENCOUNTER — Encounter (HOSPITAL_COMMUNITY): Payer: Self-pay | Admitting: *Deleted

## 2018-02-19 ENCOUNTER — Ambulatory Visit
Admission: RE | Admit: 2018-02-19 | Discharge: 2018-02-19 | Disposition: A | Discharge status: Home / Self Care | Payer: No Typology Code available for payment source | Source: Ambulatory Visit | Attending: Obstetrics and Gynecology | Admitting: Obstetrics and Gynecology

## 2018-02-19 DIAGNOSIS — N6320 Unspecified lump in the left breast, unspecified quadrant: Secondary | ICD-10-CM

## 2019-01-07 ENCOUNTER — Other Ambulatory Visit (HOSPITAL_COMMUNITY): Payer: Self-pay | Admitting: *Deleted

## 2019-01-07 DIAGNOSIS — Z1231 Encounter for screening mammogram for malignant neoplasm of breast: Secondary | ICD-10-CM

## 2019-03-01 ENCOUNTER — Other Ambulatory Visit: Payer: Self-pay

## 2019-03-01 ENCOUNTER — Ambulatory Visit (HOSPITAL_COMMUNITY)
Admission: RE | Admit: 2019-03-01 | Discharge: 2019-03-01 | Disposition: A | Discharge status: Home / Self Care | Payer: No Typology Code available for payment source | Source: Ambulatory Visit | Attending: Obstetrics and Gynecology | Admitting: Obstetrics and Gynecology

## 2019-03-01 ENCOUNTER — Encounter (HOSPITAL_COMMUNITY): Payer: Self-pay

## 2019-03-01 DIAGNOSIS — Z1239 Encounter for other screening for malignant neoplasm of breast: Secondary | ICD-10-CM | POA: Insufficient documentation

## 2019-03-01 NOTE — Progress Notes (Signed)
No complaints today.   Pap Smear: Pap smear not completed today. Last Pap smear was 10/23/2016 at [facility] and normal with negative HPV. Per patient has no history of an abnormal Pap smear. Last Pap smear result is in Epic.  Physical exam: Breasts Breasts symmetrical. No skin abnormalities bilateral breasts. No nipple retraction bilateral breasts. No nipple discharge bilateral breasts. No lymphadenopathy. No lumps palpated bilateral breasts. No complaints of pain or tenderness on exam. Referred patient to the [facility] for a screening mammogram. Appointment scheduled for Friday, March 04, 2019 at 1450.        Pelvic/Bimanual No Pap smear completed today since last Pap smear and HPV typing was 10/23/2016. Pap smear not indicated per BCCCP guidelines.   Smoking History: Patient has never smoked.  Patient Navigation: Patient education provided. Access to services provided for patient through [facility] program. Spanish interpreter provided.   Breast and Cervical Cancer Risk Assessment: Patient has no family history of breast cancer, known genetic mutations, or radiation treatment to the chest before age 47. Patient has no history of cervical dysplasia, immunocompromised, or DES exposure in-utero.  Risk Assessment    Risk Scores      03/01/2019 02/18/2018   Last edited by: Demetrius Revel, LPN Neah Sporrer, Heath Gold, RN   5-year risk: 0.6 % 0.6 %   Lifetime risk: 5.8 % 5.9 %         Used Spanish interpreter Rudene Anda from [place].

## 2019-03-01 NOTE — Patient Instructions (Signed)
Explained breast self awareness with [name]. Patient did not need a Pap smear today due to last Pap smear and HPV typing was 10/23/2016. Let her know BCCCP will cover Pap smears and HPV typing every 5 years unless has a history of abnormal Pap smears. Referred patient to the [facility] for a screening mammogram. Appointment scheduled for Friday, March 04, 2019 at 1450. Patient aware of appointment and will be there. [facility] with her within the next couple weeks with results of mammogram by letter or phone. [name] verbalized understanding.  Leveda Kendrix, Arvil Chaco, RN 12:49 PM

## 2019-03-04 ENCOUNTER — Other Ambulatory Visit: Payer: Self-pay

## 2019-03-04 ENCOUNTER — Ambulatory Visit
Admission: RE | Admit: 2019-03-04 | Discharge: 2019-03-04 | Disposition: A | Discharge status: Home / Self Care | Payer: No Typology Code available for payment source | Source: Ambulatory Visit | Attending: Obstetrics and Gynecology | Admitting: Obstetrics and Gynecology

## 2019-03-04 DIAGNOSIS — Z1231 Encounter for screening mammogram for malignant neoplasm of breast: Secondary | ICD-10-CM

## 2019-03-16 ENCOUNTER — Telehealth: Payer: Self-pay

## 2019-03-16 NOTE — Telephone Encounter (Signed)
Left message for patient about appointment with [facility] program. Left name and number for her to call back.

## 2019-03-21 ENCOUNTER — Ambulatory Visit: Payer: No Typology Code available for payment source

## 2019-04-11 ENCOUNTER — Inpatient Hospital Stay: Payer: No Typology Code available for payment source | Attending: Obstetrics and Gynecology | Admitting: *Deleted

## 2019-04-11 ENCOUNTER — Other Ambulatory Visit: Payer: Self-pay

## 2019-04-11 ENCOUNTER — Other Ambulatory Visit: Payer: Self-pay | Admitting: Obstetrics and Gynecology

## 2019-04-11 VITALS — BP 116/85 | Temp 97.7°F | Ht 64.0 in | Wt 188.0 lb

## 2019-04-11 DIAGNOSIS — Z0000 Encounter for general adult medical examination without abnormal findings: Secondary | ICD-10-CM

## 2019-04-11 NOTE — Progress Notes (Signed)
[facility] initial screening   interpreter- Rudene Anda, [employer]   Clinical Measurement:  Height: 64 in Weight: 188 lb  Blood Pressure: 118/80  Blood Pressure #2: 116/85 Fasting Labs Drawn Today, will review with patient when they result.   Medical History:  Patient states that she does not have a history of high cholesterol, high blood pressure or diabetes.  Medications:  Patient states that she does not take medication to lower cholesterol, blood pressure or blood sugar. Patient does not take an aspirin a day to help prevent a heart attack or stroke.   Blood pressure, self measurement: Patient states that she does not measure blood pressure from home and has not been told to do so by a healthcare provider.    Nutrition: Patient states that on average she eats 1 cups of fruit and 2 cups of vegetables per day. Patient states that she does eat fish at least 2 times per week. Patient eats less than half servings of whole grains. Patient drinks less than 36 ounces of beverages with added sugar weekly. Patient is currently watching sodium or salt intake. In the past 7 days patient has not had any drinks containing alcohol. On average patient does not drink any drinks containing alcohol.      Physical activity:  Patient states that she gets 180 minutes of moderate and 360 minutes of vigorous physical activity each week.  Smoking status:  Patient states that she has never smoked tobacco.   Quality of life:  Over the past 2 weeks patient states that she has not had any days where she has little interest or pleasure in doing things and 0 days where she has felt down, depressed or hopeless.    Risk reduction and counseling:    Health Coaching: Explained that the recommendation is for 2 cups of fruit and 3 cups of vegetables per day. Encouraged patient to try and add more whole grains into daily diet. Patient stated that she has been trying to loose weight recently and has joined a gym. Patient stated  that she is working out at the gym 3 days a week where she has been doing weight lifting, the treadmill and exercise bike. Patient has also been doing zumba 2 days a week. Encouraged patient to keep up the good work with her exercise.    Navigation:  I will notify patient of lab results.  Patient is aware of 2 more health coaching sessions and a follow up.  Time: 20 minutes

## 2019-04-12 LAB — HGB A1C W/O EAG: Hgb A1c MFr Bld: 5.5 % (ref 4.8–5.6)

## 2019-04-12 LAB — LIPID PANEL W/O CHOL/HDL RATIO
Cholesterol, Total: 177 mg/dL (ref 100–199)
HDL: 55 mg/dL (ref >39)
LDL Chol Calc (NIH): 98 mg/dL (ref 0–99)
Triglycerides: 136 mg/dL (ref 0–149)
VLDL Cholesterol Cal: 24 mg/dL (ref 5–40)

## 2019-04-12 LAB — GLUCOSE, RANDOM: Glucose: 99 mg/dL (ref 65–99)

## 2019-04-19 ENCOUNTER — Telehealth: Payer: Self-pay

## 2019-04-19 NOTE — Telephone Encounter (Signed)
Health coaching 2   interpreter-Erika Lovette Cliche   Labs- 177 cholesterol, 98 LDL cholesterol, 136 triglycerides, 55 HDL cholesterol , 5.5 hemoglobin A1C, 99 mean plasma glucose  Patient understands and is aware of her lab results.   Goals-  Discussed lab results with patient and answered any questions that patient had regarding result.   Goals- Be mindful of the amount of sugar and carbs that patient consumes since glucose was in the upper range of normal. We also discussed watching the amount of fried and fatty foods that patient consumes since LDL was in the upper range of normal. Also talked about adding more whole grains in daily diet as well as fruit.    Navigation:  Patient is aware of 1 more health coaching sessions and a follow up.   Time- 10 minutes

## 2019-04-21 NOTE — Progress Notes (Signed)
Patient seen and assessed by nursing staff during this encounter. I have reviewed the chart and agree with the documentation and plan. I have also made any necessary editorial changes.  Mora Bellman, MD 04/21/2019 3:00 PM

## 2019-08-19 ENCOUNTER — Other Ambulatory Visit: Payer: Self-pay | Admitting: *Deleted

## 2019-08-19 DIAGNOSIS — N644 Mastodynia: Secondary | ICD-10-CM

## 2019-08-22 ENCOUNTER — Encounter (HOSPITAL_COMMUNITY): Payer: Self-pay | Admitting: Emergency Medicine

## 2019-08-22 ENCOUNTER — Emergency Department (HOSPITAL_COMMUNITY)
Admission: EM | Admit: 2019-08-22 | Discharge: 2019-08-23 | Disposition: A | Discharge status: Home / Self Care | Payer: No Typology Code available for payment source | Attending: Emergency Medicine | Admitting: Emergency Medicine

## 2019-08-22 ENCOUNTER — Other Ambulatory Visit: Payer: Self-pay

## 2019-08-22 DIAGNOSIS — Z5321 Procedure and treatment not carried out due to patient leaving prior to being seen by health care provider: Secondary | ICD-10-CM | POA: Insufficient documentation

## 2019-08-22 DIAGNOSIS — R197 Diarrhea, unspecified: Secondary | ICD-10-CM | POA: Insufficient documentation

## 2019-08-22 LAB — URINALYSIS, ROUTINE W REFLEX MICROSCOPIC
Bilirubin Urine: NEGATIVE
Glucose, UA: NEGATIVE mg/dL
Ketones, ur: 5 mg/dL — AB
Nitrite: NEGATIVE
Protein, ur: NEGATIVE mg/dL
Specific Gravity, Urine: 1.023 (ref 1.005–1.030)
pH: 5 (ref 5.0–8.0)

## 2019-08-22 LAB — CBC
HCT: 45.6 % (ref 36.0–46.0)
Hemoglobin: 15.1 g/dL — ABNORMAL HIGH (ref 12.0–15.0)
MCH: 29.4 pg (ref 26.0–34.0)
MCHC: 33.1 g/dL (ref 30.0–36.0)
MCV: 88.9 fL (ref 80.0–100.0)
Platelets: 342 10*3/uL (ref 150–400)
RBC: 5.13 MIL/uL — ABNORMAL HIGH (ref 3.87–5.11)
RDW: 13.3 % (ref 11.5–15.5)
WBC: 7.1 10*3/uL (ref 4.0–10.5)
nRBC: 0 % (ref 0.0–0.2)

## 2019-08-22 LAB — COMPREHENSIVE METABOLIC PANEL
ALT: 38 U/L (ref 0–44)
AST: 37 U/L (ref 15–41)
Albumin: 3.8 g/dL (ref 3.5–5.0)
Alkaline Phosphatase: 95 U/L (ref 38–126)
Anion gap: 10 (ref 5–15)
BUN: 8 mg/dL (ref 6–20)
CO2: 20 mmol/L — ABNORMAL LOW (ref 22–32)
Calcium: 8.6 mg/dL — ABNORMAL LOW (ref 8.9–10.3)
Chloride: 103 mmol/L (ref 98–111)
Creatinine, Ser: 0.55 mg/dL (ref 0.44–1.00)
GFR calc Af Amer: >60 mL/min (ref >60)
GFR calc non Af Amer: >60 mL/min (ref >60)
Glucose, Bld: 121 mg/dL — ABNORMAL HIGH (ref 70–99)
Potassium: 3.9 mmol/L (ref 3.5–5.1)
Sodium: 133 mmol/L — ABNORMAL LOW (ref 135–145)
Total Bilirubin: 0.5 mg/dL (ref 0.3–1.2)
Total Protein: 8.1 g/dL (ref 6.5–8.1)

## 2019-08-22 LAB — LIPASE, BLOOD: Lipase: 27 U/L (ref 11–51)

## 2019-08-22 MED ORDER — SODIUM CHLORIDE 0.9% FLUSH: 3.0000 mL | Freq: Once | INTRAVENOUS | Status: DC

## 2019-08-22 NOTE — ED Triage Notes (Signed)
Pt c/o diarrhea that started yesterday, denies nausea/vomiting/urinary issues. Denies abdominal pain.

## 2019-08-23 NOTE — ED Notes (Signed)
Called x 2 no answer

## 2019-08-23 NOTE — ED Notes (Signed)
No answer x3

## 2019-09-20 ENCOUNTER — Ambulatory Visit: Payer: No Typology Code available for payment source

## 2019-09-20 ENCOUNTER — Telehealth: Payer: Self-pay

## 2019-09-20 ENCOUNTER — Ambulatory Visit: Payer: Self-pay | Admitting: *Deleted

## 2019-09-20 ENCOUNTER — Other Ambulatory Visit: Payer: Self-pay | Admitting: Obstetrics and Gynecology

## 2019-09-20 ENCOUNTER — Ambulatory Visit
Admission: RE | Admit: 2019-09-20 | Discharge: 2019-09-20 | Disposition: A | Discharge status: Home / Self Care | Payer: No Typology Code available for payment source | Source: Ambulatory Visit | Attending: Obstetrics and Gynecology | Admitting: Obstetrics and Gynecology

## 2019-09-20 ENCOUNTER — Ambulatory Visit: Admission: RE | Admit: 2019-09-20 | Payer: No Typology Code available for payment source | Source: Ambulatory Visit

## 2019-09-20 ENCOUNTER — Other Ambulatory Visit: Payer: Self-pay

## 2019-09-20 VITALS — Temp 97.7°F | Wt 189.6 lb

## 2019-09-20 DIAGNOSIS — Z1239 Encounter for other screening for malignant neoplasm of breast: Secondary | ICD-10-CM

## 2019-09-20 DIAGNOSIS — N644 Mastodynia: Secondary | ICD-10-CM

## 2019-09-20 NOTE — Telephone Encounter (Signed)
Health Coaching 3  interpreter- Rudene Anda, [facility]    Goals- Patient has been continuing trying to have a healthy diet. She has been trying to avoid fried and fatty foods. Patient has been exercising, encouraged patient to continue with exercise routine.   New goal- Continue trying to add more whole grains and fruits and vegetables in daily diet.   Barrier to reaching goal- NA    Strategies to overcome- NA   Navigation:  Patient is aware of  a follow up session. Patient is scheduled for follow-up appointment on October 21, 2019 @ 3:15 pm.   Time- 10 minutes

## 2019-09-20 NOTE — Progress Notes (Addendum)
[name] is a 49 y.o. female who presents to [facility] clinic today with complaint of left outer and nipple pain x one month that comes and goes. Patient rates the pain at a 4-5 out of 10. Patient complained of a left breast lump x 4-5 years with no change..    Pap Smear: Pap not smear completed today. Last Pap smear was  In January 2021 at [facility] and was abnormal - per patient. Let patient know that if Pap smear is abnormal that we will need a copy of the Pap smear result. Explained to patient that [facility] will cover the follow-up for her abnormal Pap smear if indicated. Per patient has no history of an abnormal Pap smear prior to her most recent Pap smear. Last Pap smear result is not available in Epic. Patients previous Pap smear result from 10/23/2016 is in [place].   Physical exam: Breasts Breasts symmetrical. No skin abnormalities left breast. Observed a bruise on the right breast between 1-3 o'clock next to areola that per patient is from a recent injury to her breast. No nipple retraction bilateral breasts. No nipple discharge bilateral breasts. No lymphadenopathy. No lumps palpated right breast. Palpated a pea sized lump within the left breast at 2 o'clock 7 cm from the nipple. Complaints of left outer and nipple pain on exam. Complaints of left axillary pain on exam.   Pelvic/Bimanual Pap is not indicated today per BCCCP guidelines.   Smoking History: Patient has never smoked.   Patient Navigation: Patient education provided. Access to services provided for patient through [facility] program. Spanish interpreter Rudene Anda from [facility] provided.   Breast and Cervical Cancer Risk Assessment: Patient does not have family history of breast cancer, known genetic mutations, or radiation treatment to the chest before age 80. Patient does not have history of cervical dysplasia, immunocompromised, or DES exposure in-utero.  Risk Assessment    Risk  Scores      09/20/2019 03/01/2019   Last edited by: Royston Bake, CMA McGill, Sherie Mamie Nick, LPN   5-year risk: 0.6 % 0.6 %   Lifetime risk: 5.8 % 5.8 %         A: BCCCP exam without pap smear Complaint of left outer breast pain.  P: Referred patient to the [facility] [place] for a diagnostic mammogram. Appointment scheduled Tuesday, September 20, 2019 at 1400.  Crystal Parish, RN 09/20/2019 1:06 PM

## 2019-09-20 NOTE — Patient Instructions (Signed)
Explained breast self awareness with [name]. Patient did not need a Pap smear today due to last Pap smear was in January 2021 per patient. Let her know BCCCP will cover Pap smears every 3 years or Pap smears and HPV typing every 5 years unless has a history of abnormal Pap smears. Referred patient to the [facility] for a diagnostic mammogram. Appointment scheduled Tuesday, September 20, 2019 at 1400.Patient aware of appointment and will be there. [name] verbalized understanding.  Gwynn Crossley, Arvil Chaco, RN 12:52 PM

## 2019-10-26 ENCOUNTER — Inpatient Hospital Stay: Payer: Self-pay | Attending: Obstetrics and Gynecology | Admitting: *Deleted

## 2019-10-26 ENCOUNTER — Other Ambulatory Visit: Payer: Self-pay

## 2019-10-26 VITALS — BP 116/82 | Ht 64.0 in | Wt 192.6 lb

## 2019-10-26 DIAGNOSIS — Z0000 Encounter for general adult medical examination without abnormal findings: Secondary | ICD-10-CM

## 2019-10-26 NOTE — Progress Notes (Signed)
[facility] follow up   Interpreter: Lilla Shook, [employer]  Clinical Measurement:   Vitals:   10/26/19 1514 10/26/19 1526  BP: 118/80 116/82      Medical History:  Patient states that she does not have high cholesterol, does not have high blood pressure and she does not have diabetes.  Medications:  Patient states that she does not take medication to lower cholesterol, blood pressure and blood sugar.  Patient does not take an aspirin a day to help prevent a heart attack or stroke.    Blood pressure, self measurement: Patient states that she does not measure blood pressure from home. She checks her blood pressure N/A. She shares her readings with a health care provider: N/A.   Nutrition: Patient states that on average she eats 4 cups of fruit and 1 cups of vegetables per day. Patient states that she does not eat fish at least 2 times per week. Patient eats about half servings of whole grains. Patient drinks less than 36 ounces of beverages with added sugar weekly: yes. Patient is currently watching sodium or salt intake: yes. In the past 7 days patient has had 0 drinks containing alcohol. On average patient drinks 0 drinks containing alcohol per day.      Physical activity:  Patient states that she gets 180 minutes of moderate and 0 minutes of vigorous physical activity each week.  Smoking status:  Patient states that she has has never smoked .   Quality of life:  Over the past 2 weeks patient states that she had little interest or pleasure in doing things: several days. She has been feeling down, depressed or hopeless:several days.    Risk reduction and counseling:   Health Coaching: Encouraged patient to continue trying to consume fruits and vegetables daily. I discussed the importance of whole grains and heart healthy fish in diet. Gave examples of whole grains and fish that she can try to incorporate into diet. Encouraged patient to continue with her walking.    Navigation: This was the   follow up session for this patient, I will check up on her progress in the coming months. Gave patient resource list for counseling services in [facility] as well as the orange card application.  Time: 20 minutes

## 2020-03-13 ENCOUNTER — Ambulatory Visit: Payer: Self-pay | Admitting: *Deleted

## 2020-03-13 ENCOUNTER — Other Ambulatory Visit (HOSPITAL_COMMUNITY)
Admission: RE | Admit: 2020-03-13 | Discharge: 2020-03-13 | Disposition: A | Discharge status: Home / Self Care | Payer: No Typology Code available for payment source | Source: Ambulatory Visit | Attending: Family Medicine | Admitting: Family Medicine

## 2020-03-13 ENCOUNTER — Encounter: Payer: Self-pay | Admitting: Family Medicine

## 2020-03-13 ENCOUNTER — Ambulatory Visit (INDEPENDENT_AMBULATORY_CARE_PROVIDER_SITE_OTHER): Payer: Self-pay | Admitting: Family Medicine

## 2020-03-13 ENCOUNTER — Other Ambulatory Visit: Payer: Self-pay

## 2020-03-13 ENCOUNTER — Ambulatory Visit
Admission: RE | Admit: 2020-03-13 | Discharge: 2020-03-13 | Disposition: A | Discharge status: Home / Self Care | Payer: No Typology Code available for payment source | Source: Ambulatory Visit | Attending: Obstetrics and Gynecology | Admitting: Obstetrics and Gynecology

## 2020-03-13 VITALS — BP 142/90 | Wt 195.1 lb

## 2020-03-13 VITALS — BP 130/89 | HR 83 | Ht 64.0 in | Wt 194.8 lb

## 2020-03-13 DIAGNOSIS — R87610 Atypical squamous cells of undetermined significance on cytologic smear of cervix (ASC-US): Secondary | ICD-10-CM | POA: Insufficient documentation

## 2020-03-13 DIAGNOSIS — Z1239 Encounter for other screening for malignant neoplasm of breast: Secondary | ICD-10-CM

## 2020-03-13 DIAGNOSIS — Z3202 Encounter for pregnancy test, result negative: Secondary | ICD-10-CM

## 2020-03-13 DIAGNOSIS — Z1231 Encounter for screening mammogram for malignant neoplasm of breast: Secondary | ICD-10-CM

## 2020-03-13 DIAGNOSIS — N950 Postmenopausal bleeding: Secondary | ICD-10-CM | POA: Insufficient documentation

## 2020-03-13 DIAGNOSIS — R87810 Cervical high risk human papillomavirus (HPV) DNA test positive: Secondary | ICD-10-CM

## 2020-03-13 LAB — POCT PREGNANCY, URINE: Preg Test, Ur: NEGATIVE

## 2020-03-13 NOTE — Progress Notes (Signed)
Crystal [name] is a 50 y.o. female who presents to [facility] clinic today with no complaints. Patient referred to BCCCP due to having an abnormal Pap smear 01/11/2020 that a colposcopy is recommended for follow-up.   Pap Smear: Pap smear not completed today. [facility] with positive HPV. Per patient has no history of an abnormal Pap smear prior to her most recent Pap smear. Last Pap smear resultis available in Epic.   Physical exam: Breasts Breasts symmetrical. No skin abnormalities bilateral breasts. No nipple retraction bilateral breasts. No nipple discharge bilateral breasts. No lymphadenopathy. No lumps palpated bilateral breasts. No complaints of pain or tenderness on exam.       Pelvic/Bimanual Pap is not indicated today per BCCCP guidelines.   Smoking History: Patient has never smoked.   Patient Navigation: Patient education provided. Access to services provided for patient through [facility] program. Spanish interpreter Rudene Anda from [facility] provided.    Breast and Cervical Cancer Risk Assessment: Patient does not have family history of breast cancer, known genetic mutations, or radiation treatment to the chest before age 58. Patient does not have history of cervical dysplasia, immunocompromised, or DES exposure in-utero.  Risk Assessment    Risk Scores      03/13/2020 09/20/2019   Last edited by: Demetrius Revel, LPN Royston Bake, CMA   5-year risk: 0.6 % 0.6 %   Lifetime risk: 5.8 % 5.8 %          A: BCCCP exam without pap smear No complaints.  P: Referred patient to the [facility] for a screening mammogram on the mobile unit. Appointment scheduled Tuesday, March 13, 2020 at 1410.  Referred patient to the [facility] for [place] for a colposcopy to follow-up for her abnormal Pap smear. Appointment scheduled Tuesday, March 13, 2020 at  1435.  Loletta Parish, RN 03/13/2020 1:38 PM

## 2020-03-13 NOTE — Progress Notes (Signed)
    GYNECOLOGY CLINIC COLPOSCOPY AND ENDOMETRIAL BIOPSY PROCEDURE NOTE  50 y.o. 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 Discussed role for HPV in cervical dysplasia, need for surveillance.  Patient given informed consent, signed copy in the chart, time out was performed.  Placed in lithotomy position. Cervix viewed with speculum and colposcope after application of acetic acid.   Colposcopy adequate? No, unable to visualize entire SCJ  acetowhite lesion(s) noted at 12 and 4-8 o'clock, punctation noted at 8 o'clock and mosaicism noted at 8 o'clock; corresponding were biopsies obtained.  ECC specimen was obtained.  The uterus was sounded for depth of 7. A pipelle was introduced to into the uterus, suction created,  and an endometrial sample was obtained. All equipment was removed and accounted for.  The patient tolerated the procedure well.   All specimens were labeled and sent to pathology.  Patient was given post procedure instructions.  Will follow up pathology and manage accordingly; patient will be contacted with results and recommendations.  Routine preventative health maintenance measures emphasized.  Clarnce Flock, MD/MPH Attending Family Medicine Physician, [facility] for [facility], [place]

## 2020-03-13 NOTE — Patient Instructions (Addendum)
Explained breast self awareness with [name]. Patient did not need a Pap smear today due to last Pap smear was 01/11/2020. Explained the colposcopy the recommended follow-up for her abnormal Pap smear. Referred patient to the [facility] for [place] for a colposcopy to follow-up for her abnormal Pap smear. Appointment scheduled Tuesday, March 13, 2020 at 1435. Referred patient to the [facility] for a screening mammogram on the mobile unit. Appointment scheduled Tuesday, March 13, 2020 at 1410. Patient escorted to the mobile unit following BCCCP appointment for her screening mammogram. Informed patient that the [facility] will follow-up with her within the next couple of weeks with results of her mammogram by letter or phone. [name] verbalized understanding.  Darthy Manganelli, Arvil Chaco, RN 1:39 PM

## 2020-03-13 NOTE — Patient Instructions (Signed)
Colposcopy, Care After This sheet gives you information about how to care for yourself after your procedure. Your doctor may also give you more specific instructions. If you have problems or questions, contact your doctor. What can I expect after the procedure? If you did not have a sample of your tissue taken out (did not have a biopsy), you may only have some spotting of blood for a few days. You can go back to your normal activities. If you had a sample of your tissue taken out, it is common to have:  Soreness and mild pain. These may last for a few days.  A light-headed feeling.  Mild bleeding or fluid (discharge) coming from your vagina. The fluid will look dark and grainy. You may have this for a few days. The fluid may be caused by a liquid that was used during your procedure. You may need to wear a sanitary pad.  Spotting of blood for at least 48 hours after the procedure. Follow these instructions at home: Medicines  Take over-the-counter and prescription medicines only as told by your doctor.  Ask your doctor what medicines you can start taking again. This is very important if you take blood thinners. Activity  Limit your activity for the first day after your procedure as told by your doctor.  For at least 3 days, or for as long as told by your doctor, avoid: ? Douching. ? Using tampons. ? Having sex.  Return to your normal activities as told by your doctor. Ask your doctor what activities are safe for you. General instructions  Drink enough fluid to keep your pee (urine) pale yellow.  Ask your doctor if you may take baths, swim, or use a hot tub. You may take showers.  If you use birth control (contraception), keep using it.  Keep all follow-up visits as told by your doctor. This is important.   Contact a doctor if:  You get a skin rash. Get help right away if:  You bleed a lot from your vagina. A lot of bleeding means you use more than one pad an hour for 2  hours in a row.  You have clumps of blood (blood clots) coming from your vagina.  You have a fever or chills.  You have signs of infection. This may be fluid coming from your vagina that is: ? Different than normal. ? Yellow. ? Bad-smelling.  You have very bad pain or cramps in your lower belly that do not get better with medicine.  You faint. Summary  If you did not have a sample of your tissue taken out, you may only have some spotting of blood for a few days. You can go back to your normal activities.  If you had a sample of your tissue taken out, it is common to have mild pain for a few days and spotting for 48 hours.  Avoid douching, using tampons, and having sex for at least 3 days after the procedure or for as long as told.  Get help right away if you have a lot of bleeding, very bad pain, or signs of infection. This information is not intended to replace advice given to you by your health care provider. Make sure you discuss any questions you have with your health care provider. Document Revised: 11/15/2019 Document Reviewed: 01/12/2019 Elsevier Patient Education  [place] POST-PROCEDURE INSTRUCTIONS  1. You may take Ibuprofen, Aleve or Tylenol for pain if needed.  Cramping should  resolve within in 24 hours.  2. You may have a small amount of spotting.  You should wear a mini pad for the next few days.  3. You may have intercourse after 24 hours.  4. You need to call if you have any pelvic pain, fever, heavy bleeding or foul smelling vaginal discharge.  5. Shower or bathe as normal  6. We will call you within one week with results or we will discuss   the results at your follow-up appointment if needed.

## 2020-03-13 NOTE — Progress Notes (Signed)
z1

## 2020-03-15 LAB — SURGICAL PATHOLOGY

## 2020-03-17 ENCOUNTER — Telehealth: Payer: Self-pay | Admitting: Family Medicine

## 2020-03-17 ENCOUNTER — Encounter: Payer: Self-pay | Admitting: Family Medicine

## 2020-03-17 DIAGNOSIS — R87610 Atypical squamous cells of undetermined significance on cytologic smear of cervix (ASC-US): Secondary | ICD-10-CM | POA: Insufficient documentation

## 2020-03-17 DIAGNOSIS — N950 Postmenopausal bleeding: Secondary | ICD-10-CM | POA: Insufficient documentation

## 2020-03-17 HISTORY — DX: Postmenopausal bleeding: N95.0

## 2020-03-17 NOTE — Telephone Encounter (Signed)
Called patient to discuss results of her EMB and colposcopy biopsies.   EMB showed benign pathology with polyp. Discussed that the polyp was likely the cause of her bleeding and in the absence of further bleeding no more follow up was needed.   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 Per ASCCP guidelines I recommended she repeat a pap smear in 1 year, and emphasized how important this was.  All questions answered.

## 2020-04-04 ENCOUNTER — Other Ambulatory Visit: Payer: Self-pay

## 2020-04-04 ENCOUNTER — Emergency Department (HOSPITAL_COMMUNITY)
Admission: EM | Admit: 2020-04-04 | Discharge: 2020-04-05 | Disposition: A | Discharge status: Home / Self Care | Payer: No Typology Code available for payment source | Attending: Emergency Medicine | Admitting: Emergency Medicine

## 2020-04-04 DIAGNOSIS — R110 Nausea: Secondary | ICD-10-CM | POA: Insufficient documentation

## 2020-04-04 DIAGNOSIS — R300 Dysuria: Secondary | ICD-10-CM | POA: Insufficient documentation

## 2020-04-04 LAB — CBC
HCT: 41.8 % (ref 36.0–46.0)
Hemoglobin: 13.6 g/dL (ref 12.0–15.0)
MCH: 29.1 pg (ref 26.0–34.0)
MCHC: 32.5 g/dL (ref 30.0–36.0)
MCV: 89.5 fL (ref 80.0–100.0)
Platelets: 365 10*3/uL (ref 150–400)
RBC: 4.67 MIL/uL (ref 3.87–5.11)
RDW: 12.5 % (ref 11.5–15.5)
WBC: 8.8 10*3/uL (ref 4.0–10.5)
nRBC: 0 % (ref 0.0–0.2)

## 2020-04-04 LAB — BASIC METABOLIC PANEL
Anion gap: 7 (ref 5–15)
BUN: 12 mg/dL (ref 6–20)
CO2: 29 mmol/L (ref 22–32)
Calcium: 8.9 mg/dL (ref 8.9–10.3)
Chloride: 98 mmol/L (ref 98–111)
Creatinine, Ser: 0.69 mg/dL (ref 0.44–1.00)
GFR, Estimated: >60 mL/min (ref >60)
Glucose, Bld: 95 mg/dL (ref 70–99)
Potassium: 4 mmol/L (ref 3.5–5.1)
Sodium: 134 mmol/L — ABNORMAL LOW (ref 135–145)

## 2020-04-04 LAB — URINALYSIS, ROUTINE W REFLEX MICROSCOPIC
Bacteria, UA: NONE SEEN
Bilirubin Urine: NEGATIVE
Glucose, UA: NEGATIVE mg/dL
Hgb urine dipstick: NEGATIVE
Ketones, ur: NEGATIVE mg/dL
Nitrite: NEGATIVE
Protein, ur: NEGATIVE mg/dL
Specific Gravity, Urine: 1.011 (ref 1.005–1.030)
pH: 6 (ref 5.0–8.0)

## 2020-04-04 NOTE — ED Triage Notes (Signed)
Pt c/o difficulty urinating with pain. This has been ongoing since Friday.

## 2020-04-05 MED ORDER — CEPHALEXIN 500 MG PO CAPS: 500.0000 mg | ORAL_CAPSULE | Freq: Two times a day (BID) | ORAL | 0 refills | Status: DC

## 2020-04-05 MED ORDER — CEPHALEXIN 250 MG PO CAPS
500.0000 mg | ORAL_CAPSULE | Freq: Once | ORAL | Status: AC
  Administered 2020-04-05: 500 mg via ORAL
  Filled 2020-04-05: qty 2

## 2020-04-05 NOTE — ED Provider Notes (Signed)
[facility] EMERGENCY DEPARTMENT Provider Note   CSN: [id number] Arrival date & time: 04/04/20  1828     History Chief Complaint  Patient presents with  . Dysuria    Crystal [name] is a 50 y.o. female.  Patient presents to the emergency department with chief complaint of dysuria.  She reports this is been ongoing for the past 4 to 5 days.  She reports associated nausea, but denies vomiting or fever.  She denies any flank pain.  She denies history of kidney stones.  Denies any other associated symptoms.  The history is provided by the patient. No language interpreter was used.       Past Medical History:  Diagnosis Date  . Depression     Patient Active Problem List   Diagnosis Date Noted  . ASCUS with positive high risk HPV cervical 03/17/2020  . Postmenopausal bleeding 03/17/2020  . Screening breast examination 03/01/2019  . Breast lump on right side at 10 o'clock position 09/21/2012  . Calculus of gallbladder with acute cholecystitis, without mention of obstruction 08/10/2012    Past Surgical History:  Procedure Laterality Date  . CHOLECYSTECTOMY N/A 07/28/2012   Procedure: LAPAROSCOPIC CHOLECYSTECTOMY WITH INTRAOPERATIVE CHOLANGIOGRAM;  Surgeon: Imogene Burn. Georgette Dover, MD;  Location: [facility];  Service: General;  Laterality: N/A;  . TUBAL LIGATION     7 years ago     OB History    Gravida  6   Para  4   Term  4   Preterm      AB  1   Living  5     SAB      IAB      Ectopic      Multiple  1   Live Births  5           Family History  Problem Relation Age of Onset  . Hypertension Mother   . Diabetes Mother   . Heart disease Father   . Diabetes Father   . Cancer Maternal Aunt        uterine  . Diabetes Sister   . Diabetes Brother   . Diabetes Sister   . Anesthesia problems Neg Hx     Social History   Tobacco Use  . Smoking status: Never Smoker  . Smokeless tobacco: Never Used  Vaping Use  . Vaping Use: Never  used  Substance Use Topics  . Alcohol use: No  . Drug use: No    Home Medications Prior to Admission medications   Medication Sig Start Date End Date Taking? Authorizing Provider  cephALEXin (KEFLEX) 500 MG capsule Take 1 capsule (500 mg total) by mouth 2 (two) times daily. 04/05/20  Yes Montine Circle, PA-C  cyclobenzaprine (FLEXERIL) 5 MG tablet Take 1 tablet (5 mg total) by mouth 2 (two) times daily as needed for muscle spasms. Patient not taking: Reported on 07/01/2014 05/18/14   Ashley Murrain, NP  metroNIDAZOLE (FLAGYL) 500 MG tablet Take 1 tablet (500 mg total) by mouth 2 (two) times daily. Patient not taking: Reported on 02/25/2017 10/27/16   Constant, Peggy, MD  naproxen (NAPROSYN) 500 MG tablet Take 1 tablet (500 mg total) by mouth 2 (two) times daily. Patient not taking: Reported on 10/26/2019 05/18/14   Ashley Murrain, NP  terbinafine (LAMISIL) 250 MG tablet Take 1 tablet (250 mg total) by mouth daily. Patient not taking: Reported on 12/29/2014 07/01/14   Roselee Culver, MD    Allergies    Patient  has no known allergies.  Review of Systems   Review of Systems  All other systems reviewed and are negative.   Physical Exam Updated Vital Signs BP 138/87 (BP Location: Right Arm)   Pulse 75   Temp 98.5 F (36.9 C) (Oral)   Resp 18   Ht 5\' 3"  (1.6 m)   Wt 88 kg   LMP 12/10/2019 (Exact Date)   SpO2 100%   BMI 34.37 kg/m   Physical Exam Vitals and nursing note reviewed.  Constitutional:      General: She is not in acute distress.    Appearance: She is well-developed and well-nourished.  HENT:     Head: Normocephalic and atraumatic.  Eyes:     Conjunctiva/sclera: Conjunctivae normal.  Cardiovascular:     Rate and Rhythm: Normal rate and regular rhythm.     Heart sounds: No murmur heard.   Pulmonary:     Effort: Pulmonary effort is normal. No respiratory distress.     Breath sounds: Normal breath sounds.  Abdominal:     Palpations: Abdomen is soft.      Tenderness: There is abdominal tenderness.     Comments: Suprapubic tenderness  Musculoskeletal:        General: No edema. Normal range of motion.     Cervical back: Neck supple.  Skin:    General: Skin is warm and dry.  Neurological:     Mental Status: She is alert and oriented to person, place, and time.  Psychiatric:        Mood and Affect: Mood and affect and mood normal.        Behavior: Behavior normal.     ED Results / Procedures / Treatments   Labs (all labs ordered are listed, but only abnormal results are displayed) Labs Reviewed  URINALYSIS, ROUTINE W REFLEX MICROSCOPIC - Abnormal; Notable for the following components:      Result Value   Leukocytes,Ua MODERATE (*)    All other components within normal limits  BASIC METABOLIC PANEL - Abnormal; Notable for the following components:   Sodium 134 (*)    All other components within normal limits  CBC    EKG None  Radiology No results found.  Procedures Procedures   Medications Ordered in ED Medications  cephALEXin (KEFLEX) capsule 500 mg (has no administration in time range)    ED Course  I have reviewed the triage vital signs and the nursing notes.  Pertinent labs & imaging results that were available during my care of the patient were reviewed by me and considered in my medical decision making (see chart for details).    MDM Rules/Calculators/A&P                          Patient here with dysuria.  Symptoms are consistent with UTI.  UA is abnormal.  Will treat with keflex.    Doubt KS or pyelo.  Patient is postmenopausal. Final Clinical Impression(s) / ED Diagnoses Final diagnoses:  Dysuria    Rx / DC Orders ED Discharge Orders         Ordered    cephALEXin (KEFLEX) 500 MG capsule  2 times daily        04/05/20 0034           Montine Circle, PA-C 04/05/20 0036    Fatima Blank, MD 04/05/20 [phone]

## 2020-04-25 ENCOUNTER — Other Ambulatory Visit: Payer: Self-pay

## 2020-04-25 ENCOUNTER — Encounter: Payer: Self-pay | Admitting: General Practice

## 2020-04-25 ENCOUNTER — Inpatient Hospital Stay: Payer: Self-pay | Attending: Obstetrics and Gynecology | Admitting: *Deleted

## 2020-04-25 VITALS — BP 120/84 | Ht 63.0 in | Wt 194.8 lb

## 2020-04-25 DIAGNOSIS — Z0000 Encounter for general adult medical examination without abnormal findings: Secondary | ICD-10-CM

## 2020-04-25 NOTE — Progress Notes (Signed)
Wisewoman initial [place], Erling Cruz   Clinical Measurement:  Vitals:   04/25/20 0934  BP: 120/84   Fasting Labs Drawn Today, will review with patient when they result.   Medical History:  Patient states that she does not have high cholesterol, does not have high blood pressure and she does not have diabetes.  Medications:  Patient states that she does not take medication to lower cholesterol, blood pressure or blood sugar.  Patient does not take an aspirin a day to help prevent a heart attack or stroke.     Blood pressure, self measurement: Patient states that she does not measure blood pressure from home. She checks her blood pressure N/A. She shares her readings with a health care provider: N/A.   Nutrition: Patient states that on average she eats 4 cups of fruit and 0 cups of vegetables per day. Patient states that she does eat fish at least 2 times per week. Patient eats less than half servings of whole grains. Patient drinks less than 36 ounces of beverages with added sugar weekly: yes. Patient is currently watching sodium or salt intake: yes. In the past 7 days patient has consumed drinks containing alcohol on 0 days. On a day that patient consumes drinks containing alcohol on average 0 drinks are consumed.      Physical activity:  Patient states that she gets 180 minutes of moderate and 180 minutes of vigorous physical activity each week.  Smoking status:  Patient states that she has has never smoked .   Quality of life:  Over the past 2 weeks patient states that she had little interest or pleasure in doing things: several days. She has been feeling down, depressed or hopeless:not at all.    Risk reduction and counseling:   Health Coaching: Spoke with patient about the daily recommendation for vegetables. Showed patient what 1 serving would look like. Educated patient what whole grains are and how they are part of a heart healthy diet. Gave suggestion  for whole wheat bread or pasta, whole grain cereals, brown rice or oatmeal. Patient does currently consume oatmeal. Patient currently consumes salmon 2-3 times per week. Patient has been doing zumba classes x4 weekly for 1.5 hours per class.    Navigation:  I will notify patient of lab results.  Patient is aware of 2 more health coaching sessions and a follow up.  Time: 25 minutes

## 2020-04-26 LAB — GLUCOSE, RANDOM: Glucose: 105 mg/dL — ABNORMAL HIGH (ref 65–99)

## 2020-04-26 LAB — LIPID PANEL
Chol/HDL Ratio: 3.9 ratio (ref 0.0–4.4)
Cholesterol, Total: 185 mg/dL (ref 100–199)
HDL: 47 mg/dL (ref >39)
LDL Chol Calc (NIH): 117 mg/dL — ABNORMAL HIGH (ref 0–99)
Triglycerides: 118 mg/dL (ref 0–149)
VLDL Cholesterol Cal: 21 mg/dL (ref 5–40)

## 2020-04-26 LAB — HEMOGLOBIN A1C
Est. average glucose Bld gHb Est-mCnc: 126 mg/dL
Hgb A1c MFr Bld: 6 % — ABNORMAL HIGH (ref 4.8–5.6)

## 2020-04-30 ENCOUNTER — Telehealth: Payer: Self-pay

## 2020-04-30 NOTE — Telephone Encounter (Signed)
Health coaching 2   interpreter- [address] [employer], [phone]   Labs- 185 cholesterol, 117 LDL cholesterol, 118 triglycerides, 47 HDL cholesterol, 6.0 hemoglobin A1C, 105 mean plasma glucose. Patient understands and is aware of her lab results.   Goals-  1. Reduce the amount of fried foods consumed. Try to grill, bake, broil or sautee foods instead. 2. Reduce the amount of red meats consumed. Substitute for lean proteins like chicken or fish.  3. Reduce the amount of whole fat dairy products consumed. Substitute for low-fat or reduced-fat dairy options. 4. Watch the amount of sugar consumed in both food and drink. 5. Reduce the amount of carbs consumed.    Spoke with patient about practicing a diabetes diet.  Navigation:  Patient is aware of 1 more health coaching sessions and a follow up. Patient is scheduled for follow-up with Internal Medicine on May 07, 2020 @ 8:45 am. Offered to refer patient to diabetes prevention program through [facility]. Patient agreed to participate and will send referral.   Time- 15 minutes

## 2020-05-07 ENCOUNTER — Other Ambulatory Visit (HOSPITAL_COMMUNITY): Payer: Self-pay

## 2020-05-07 ENCOUNTER — Encounter: Payer: Self-pay | Admitting: Student

## 2020-05-07 ENCOUNTER — Other Ambulatory Visit: Payer: Self-pay

## 2020-05-07 ENCOUNTER — Ambulatory Visit (INDEPENDENT_AMBULATORY_CARE_PROVIDER_SITE_OTHER): Payer: Self-pay | Admitting: Student

## 2020-05-07 VITALS — BP 119/80 | HR 77 | Temp 98.5°F | Ht 63.0 in | Wt 195.3 lb

## 2020-05-07 DIAGNOSIS — R7303 Prediabetes: Secondary | ICD-10-CM | POA: Insufficient documentation

## 2020-05-07 DIAGNOSIS — Z0000 Encounter for general adult medical examination without abnormal findings: Secondary | ICD-10-CM

## 2020-05-07 DIAGNOSIS — N3000 Acute cystitis without hematuria: Secondary | ICD-10-CM | POA: Insufficient documentation

## 2020-05-07 DIAGNOSIS — R87810 Cervical high risk human papillomavirus (HPV) DNA test positive: Secondary | ICD-10-CM

## 2020-05-07 DIAGNOSIS — F32A Depression, unspecified: Secondary | ICD-10-CM

## 2020-05-07 DIAGNOSIS — E669 Obesity, unspecified: Secondary | ICD-10-CM | POA: Insufficient documentation

## 2020-05-07 DIAGNOSIS — E7800 Pure hypercholesterolemia, unspecified: Secondary | ICD-10-CM | POA: Insufficient documentation

## 2020-05-07 DIAGNOSIS — R87610 Atypical squamous cells of undetermined significance on cytologic smear of cervix (ASC-US): Secondary | ICD-10-CM

## 2020-05-07 DIAGNOSIS — F3340 Major depressive disorder, recurrent, in remission, unspecified: Secondary | ICD-10-CM | POA: Insufficient documentation

## 2020-05-07 HISTORY — DX: Acute cystitis without hematuria: N30.00

## 2020-05-07 MED ORDER — SULFAMETHOXAZOLE-TRIMETHOPRIM 400-80 MG PO TABS
1.0000 | ORAL_TABLET | Freq: Two times a day (BID) | ORAL | 0 refills | Status: AC
  Filled 2020-05-07: qty 6, 3d supply, fill #0

## 2020-05-07 NOTE — Assessment & Plan Note (Signed)
Denies any abnormal vaginal bleeding currently.   - She is due for repeat pap smear ~02/2021

## 2020-05-07 NOTE — Assessment & Plan Note (Addendum)
Hemoglobin A1c was found to be elevated at 6.0 during Wise Woman exam. She denies any personal history of DM, although has extensive family history of DM and is not on antihyperglycemic agents.   - Encouraged increased vegetable intake and decreased simple sugar and trans/saturated fat intake  - She is interested in seeing Butch Penny Plyler once establishing insurance  - Consider addition of metformin once she has insurance  - Will need repeat Hgb A1c in 3 months

## 2020-05-07 NOTE — Assessment & Plan Note (Signed)
BMI is 34.60. 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 She has recently stopped drinking juices and sodas and is working on cutting down the amount of saturated / trans fats in her diet, although does continue to eat breads, pastas, and other simple carbs with limited vegetable intake.   - Discussed the importance of portion control  - Provided nutritional education  - Continue to encourage regular physical activity

## 2020-05-07 NOTE — Assessment & Plan Note (Addendum)
Patient has never had a colonoscopy. Denies family history of colon cancer. She has had 3 COVID-19 vaccinations (next due 07/26/20). She cannot recall if she's ever had a tetanus vaccine. She is not due for repeat pap smear or mammography until 02/2021. She has been screened for HIV in the past.  - Will require HCV antibody screening for Hepatitis C and TDap vaccination - Will educate patient on need for second COVID-19 booster - Will need referral to GI for colonoscopy  - Will hold off on above until she is able to obtain insurance  - Encouraged her to schedule an appointment with financial advising

## 2020-05-07 NOTE — Patient Instructions (Signed)
Cherylann Banas,  Fue un placer conocerte hoy! Tienes una Yahoo en general!  Se descubri que tiene prediabetes con niveles elevados de azcar en la sangre y est clasificado como obeso segn su [place], aunque es excelente que sea muy activo fsicamente y parece estar haciendo los cambios dietticos adecuados para ayudar a reducir los niveles de Location manager y [place]. Adjunto informacin [employer] en la dieta que puede hacer para ayudarlo con esto.  Seguir adelante y lo tratar por una infeccin del tracto urinario con antibiticos (Bactrim) durante 3 das. Tome 1 pastilla por la maana y 1 pastilla por la noche hasta que se le acaben las pastillas.  Nuestro nmero de telfono de [facility] es [phone]. Llame si sus sntomas urinarios no se resuelven despus de tomar los antibiticos o si presenta fiebre, escalofros, dolor en el costado, nuseas, vmitos o cualquier otro sntoma preocupante.  Vernell Barrier, lo ayudaremos a obtener una cita con nuestro asesor financiero para ayudarlo a Sports administrator.  Una vez que [employer], llame a [place] oficina y le programaremos una cita de seguimiento. En ese momento, podemos vacunarlo contra el ttanos, realizarle una prueba de deteccin de hepatitis para adultos por nica vez, referirlo para una colonoscopia y evaluar si puede beneficiarse de algn medicamento diario.  [place],  Dr. Konrad Penta  ---------------------------  Ms. Baby,   It was a pleasure meeting you today! You are in great health overall!   You were found to have pre-diabetes with elevated blood sugars and are classified as obese per your BMI, although it is great that you are very physically active and seem to be making the proper dietary changes to help bring down your sugars and weight. I have attached information regarding dietary changes you can make to assist you with this.  I will go ahead and treat you for a urinary tract infection with  antibiotics (Bactrim) for 3 days. Please take 1 pill in the morning and 1 pill in the evening until you run out of pills.   Our [facility] phone number is [phone]. Please call if your urinary symptoms do not resolve after taking the antibiotics or you develop fevers, chills, flank pain, nausea, vomiting, or any other concerning symptoms.   In the meantime, we will help you get an appointment with our financial counseling to assist you in obtaining insurance.   Once you obtain insurance, please give our office a call and we will set you up with a follow up appointment. At that time, we can give you your tetanus vaccination, get your 1-time adult screening for hepatitis, refer you for a colonoscopy, and assess whether you may benefit from any daily medications.   Take care,  Dr. Konrad Penta    Diabetes mellitus y nutricin, en adultos Diabetes Mellitus and Nutrition, Adult Si sufre de diabetes, o diabetes mellitus, es muy importante tener hbitos alimenticios saludables debido a que sus niveles de Designer, television/film set sangre (glucosa) se ven afectados en gran medida por lo que come y bebe. Comer alimentos saludables en las cantidades correctas, aproximadamente a la misma hora todos los [place], [place] ayudar a:  Aeronautical engineer glucemia.  Disminuir el riesgo de sufrir una enfermedad cardaca.  Mejorar la presin arterial.  Science writer o mantener un peso saludable. Qu puede afectar mi plan de alimentacin? Todas las personas que sufren de diabetes son diferentes y cada una tiene necesidades diferentes en cuanto a un plan de alimentacin. El mdico puede recomendarle que trabaje con  un nutricionista para Financial trader plan para usted. Su plan de alimentacin puede variar segn factores como:  Las caloras que necesita.  Los medicamentos que toma.  Su peso.  Sus niveles de glucemia, presin arterial y colesterol.  Su nivel de [place].  Otras afecciones que tenga, como enfermedades cardacas o  renales. Cmo me afectan los carbohidratos? Los carbohidratos, o hidratos de carbono, afectan su nivel de glucemia ms que cualquier otro tipo de alimento. La ingesta de carbohidratos naturalmente aumenta la cantidad de [employer]. El recuento de carbohidratos es un mtodo destinado a Catering manager un registro de la cantidad de carbohidratos que se consumen. El recuento de carbohidratos es importante para Theatre manager la glucemia a un nivel saludable, especialmente si utiliza insulina o toma determinados medicamentos por va oral para la diabetes. Es importante conocer la cantidad de carbohidratos que se pueden ingerir en cada comida sin correr Engineer, manufacturing. Esto es Psychologist, forensic. Su nutricionista puede ayudarlo a calcular la cantidad de carbohidratos que debe ingerir en cada comida y en cada refrigerio. Cmo me afecta el alcohol? El alcohol puede provocar disminuciones sbitas de la glucemia (hipoglucemia), especialmente si utiliza insulina o toma determinados medicamentos por va oral para la diabetes. La hipoglucemia es una afeccin potencialmente mortal. Los sntomas de la hipoglucemia, como somnolencia, mareos y confusin, son similares a los sntomas de haber consumido demasiado alcohol.  No beba alcohol si: ? Su mdico le indica no hacerlo. ? Est embarazada, puede estar embarazada o est tratando de quedar embarazada.  Si bebe alcohol: ? No beba con el estmago vaco. ? Limite la cantidad que bebe:  De 0 a 1 medida por da para las mujeres.  De 0 a 2 medidas por da para los hombres. ? Est atento a la cantidad de alcohol que hay en las bebidas que toma. En los [place], una medida equivale a una botella de cerveza de 12oz (335ml), un vaso de vino de 5oz (171ml) o un vaso de una bebida alcohlica de alta graduacin de 1oz (22ml). ? Mantngase hidratado bebiendo agua, refrescos dietticos o t helado sin azcar.  Tenga en cuenta que los refrescos comunes, los  jugos y otras bebida para Optician, dispensing pueden contener mucha azcar y se deben contar como carbohidratos. Consejos para seguir Photographer las etiquetas de los alimentos  Comience por leer el tamao de la porcin en la "Informacin nutricional" en las etiquetas de los alimentos envasados y las bebidas. La cantidad de caloras, carbohidratos, grasas y otros nutrientes mencionados en la etiqueta se basan en una porcin del alimento. Muchos alimentos contienen ms de una porcin por envase.  Verifique la cantidad total de gramos (g) de carbohidratos totales en una porcin. Puede calcular la cantidad de porciones de carbohidratos al dividir el total de carbohidratos por 15. Por ejemplo, si un alimento tiene un total de 30g de carbohidratos totales por porcin, equivale a 2 porciones de carbohidratos.  Verifique la cantidad de gramos (g) de grasas saturadas y grasas trans de una porcin. Escoja alimentos que no contengan estas grasas o que su contenido de estas sea [place].  Verifique la cantidad de miligramos (mg) de sal (sodio) en una porcin. La mayora de las personas deben limitar la ingesta de sodio total a menos de 2300mg  por Training and development officer.  Siempre consulte la informacin nutricional de los alimentos etiquetados como "con bajo contenido de grasa" o "sin grasa". Estos alimentos pueden tener un mayor contenido de azcar agregada o carbohidratos refinados, y  deben evitarse.  Hable con su nutricionista para identificar sus objetivos diarios en cuanto a los nutrientes mencionados en la etiqueta. Al ir de compras  Evite comprar alimentos procesados, enlatados o precocidos. Estos alimentos tienden a Special educational needs teacher mayor cantidad de [place], sodio y azcar agregada.  Compre en la zona exterior de la tienda de comestibles. Esta es la zona donde se encuentran con mayor frecuencia las frutas y las verduras frescas, los cereales a granel, las carnes frescas y los productos lcteos frescos. Al cocinar  Utilice mtodos de  coccin a baja temperatura, como hornear, en lugar de mtodos de coccin a alta temperatura, como frer en abundante aceite.  Cocine con aceites saludables, como el aceite de [place], canola o [place].  Evite cocinar con manteca, crema o carnes con alto contenido de grasa. Planificacin de las comidas  Coma las comidas y los refrigerios regularmente, preferentemente a la misma hora todos [place]. Evite pasar largos perodos de tiempo sin comer.  Consuma alimentos ricos en fibra, como frutas frescas, verduras, frijoles y cereales integrales. Consulte a su nutricionista sobre cuntas porciones de carbohidratos puede consumir en cada comida.  Consuma entre 4 y 6 onzas (entre 112 y 168g) de protenas magras por da, como carnes magras, pollo, pescado, huevos o tofu. Una onza (oz) de protena magra equivale a: ? 1 onza (28g) de carne, pollo o pescado. ? 1huevo. ?  de taza (62 g) de tofu.  Coma algunos alimentos por da que contengan grasas saludables, como aguacates, frutos secos, semillas y pescado.   Qu alimentos debo comer? Lambert Mody Bayas. Manzanas. Naranjas. Duraznos. Damascos. Ciruelas. Uvas. Mango. Papaya. Baring. Kiwi. Cerezas. [place] Valeda Malm. Espinaca. Verduras de [employer], que incluyen col rizada, [place], hojas de [place] y de [place]. Remolachas. Coliflor. Repollo. Brcoli. Zanahorias. Judas verdes. Tomates. Pimientos. Cebollas. Pepinos. Coles de Bruselas. Granos Granos integrales, como panes, galletas, tortillas, cereales y pastas de salvado o integrales. Avena sin azcar. Quinua. Arroz integral o salvaje. Carnes y Psychiatric nurse. Carne de ave sin piel. Cortes magros de ave y carne de res. Tofu. Frutos secos. Semillas. Lcteos Productos lcteos sin grasa o con bajo contenido de [place], [place], yogur y [place]. Es posible que los productos que se enumeran ms [place] no constituyan una lista completa de los alimentos y las bebidas que puede tomar. Consulte a un  nutricionista para obtener ms informacin. Qu alimentos debo evitar? Lambert Mody Frutas enlatadas al almbar. Verduras Verduras enlatadas. Verduras congeladas con mantequilla o salsa de crema. Granos Productos elaborados con [place] y [place], como panes, pastas, bocadillos y cereales. Evite todos los alimentos procesados. Carnes y otras protenas Cortes de carne con alto contenido de Lobbyist. Carne de ave con piel. Carnes empanizadas o fritas. Carne procesada. Evite las grasas saturadas. Lcteos Yogur, [place] enteros. Bebidas Bebidas azucaradas, como gaseosas o t helado. Es posible que los productos que se enumeran ms [place] no constituyan una lista completa de los alimentos y las bebidas que Nurse, adult. Consulte a un nutricionista para obtener ms informacin. Preguntas para hacerle al mdico  Es necesario que me rena con Radio broadcast assistant en el cuidado de la diabetes?  Es necesario que me rena con un nutricionista?  A qu nmero puedo llamar si tengo preguntas?  Cules son los mejores momentos para controlar la glucemia? Dnde encontrar ms informacin:  Asociacin Estadounidense de la Diabetes (American Diabetes Association): diabetes.org  Academy of Nutrition and Dietetics (Academia de Nutricin y Information systems manager): www.eatright.CSX Corporation of Diabetes and  Digestive and Kidney Diseases [employer] Financial de la Diabetes y [place] y Renales): DesMoinesFuneral.dk  Association of Diabetes Care and Education Specialists (Asociacin de Especialistas en Atencin y Educacin sobre la Diabetes): www.diabeteseducator.org Resumen  Es importante tener hbitos alimenticios saludables debido a que sus niveles de Designer, television/film set sangre (glucosa) se ven afectados en gran medida por lo que come y bebe.  Un plan de alimentacin saludable lo ayudar a controlar la glucemia y Theatre manager un estilo de vida saludable.  El mdico puede recomendarle que  trabaje con un nutricionista para elaborar el mejor plan para usted.  Tenga en cuenta que los carbohidratos (hidratos de carbono) y el alcohol tienen efectos inmediatos en sus niveles de glucemia. Es importante contar los carbohidratos que ingiere y consumir alcohol con prudencia. Esta informacin no tiene Marine scientist el consejo del mdico. Asegrese de hacerle al mdico cualquier pregunta que tenga. Document Revised: 02/17/2019 Document Reviewed: 02/17/2019 Elsevier Patient Education  2021 [employer].

## 2020-05-07 NOTE — Assessment & Plan Note (Signed)
Patient has a history of depression. She does note that she has had decreased interest in activities that she previously enjoyed due to struggles with fatigue, although notes this is only 1 day per week. Denies feeling down, depressed, or hopeless. PHQ-9 score is 2 today. She is not on any antidepressants.   - Continue to monitor symptoms

## 2020-05-07 NOTE — Assessment & Plan Note (Signed)
Patient has had 3 weeks of dysuria associated with intermittent left flank pain. She had a U/A 04/04/20 positive for moderate leukocytes and 21-50 WBC's without RBC's or visible bacteria. No culture was performed at the time. She appears comfortable and has no fevers, chills, current flank pain, or other symptoms to suggest she has pyelonephritis. Feels similar to previous UTI's.  - Will treat empirically with 3 days of Bactrim BID  - Instructed her to follow up if symptoms fail to resolve or worsen despite the above

## 2020-05-07 NOTE — Progress Notes (Signed)
   CC: Dysuria   HPI:  [name] is a 50 y.o. Spanish-speaking, obese lady, presenting following her Crystal [name] Woman exam to establish care with our clinic. She notes she has had 3 weeks of dysuria associated with intermittent left-sided flank pain and says this feels similar to previous UTI's in the past. She is not currently experiencing left flank pain. She denies any other symptoms, including fevers, chills, nausea, vomiting, abdominal pain, vaginal discharge or bleeding, hematuria, changes in bowel habits, CP, SOB or LE swelling. She does say she has been fatigued, although states this doesn't interfere with her daily activity. She gets frequent, high intensity exercise and has been trying to reduce the amount of simple sugars and fats in her diet. She has not previously followed with a primary care physician. She has received 3 COVID-19 boosters, although is due for a tetanus vaccination. She is otherwise up to date on her screenings without any other concerns.   Spanish Interpreter services were offered to patient; however, she refused this and instead her daughter translated for her.   Past Medical History:  Diagnosis Date  . CIN I (cervical intraepithelial neoplasia I)    Pap 02/2019: ASCUS w/ high risk HPV  . Depression   . Elevated LDL cholesterol level   . Obesity (BMI 30-39.9)   . Post-menopausal bleeding   . Pre-diabetes    Medications:  Patient takes no daily or OTC medications at home.   Social Hx:  Patient works as a Education administrator lady.  She has never smoked.  She denies any alcohol or illicit drug use.   Family History  Problem Relation Age of Onset  . Hypertension Mother   . Diabetes Mother   . Heart disease Father   . Diabetes Father   . Cancer Maternal Aunt        uterine  . Diabetes Sister   . Diabetes Brother   . Diabetes Sister   . Anesthesia problems Neg Hx    Surgical Hx:  Patient is s/p bilateral tubal ligation and cholecystectomy.   Review  of Systems:  All others negative except as noted above in HPI.   Physical Exam:  Vitals:   05/07/20 0838  BP: 119/80  Pulse: 77  Temp: 98.5 F (36.9 C)  TempSrc: Oral  SpO2: 98%  Weight: 195 lb 4.8 oz (88.6 kg)  Height: 5\' 3"  (1.6 m)   General: Patient is obese. She appears well. No acute distress. Eyes: Sclera non-icteric. No conjunctival injection.  HENT: MMM. No nasal discharge. Respiratory: Lungs are CTA, bilaterally. No wheezes, rales, or rhonchi.  Cardiovascular: Regular rate and rhythm. No murmurs, rubs, or gallops. No lower extremity edema. Neurological: Alert and oriented x 3.  Musculoskeletal: Normal muscle bulk and tone. ROMI in all four extremities.  Abdominal: Soft and non-tender to palpation. Bowel sounds intact. No rebound or guarding. GU: No suprapubic or CVA tenderness to palpation.  Skin: No lesions. No rashes.  Psych: Slightly flat affect. Normal tone of voice.   Assessment & Plan:   See Encounters Tab for problem based charting.  Patient discussed with Dr. Dareen Piano.  Jeralyn Bennett, MD 05/07/2020, 9:49 AM Pager: [phone]

## 2020-05-07 NOTE — Assessment & Plan Note (Signed)
Patient had isolated elevation in LDL to 117 on [facility] exam. ASCVD risk is 1.1%  - Encouraged weight loss and healthy eating - No statin is indicated at this time

## 2020-05-09 NOTE — Progress Notes (Signed)
Internal Medicine Clinic Attending  Case discussed with Dr. Speakman  At the time of the visit.  We reviewed the resident's history and exam and pertinent patient test results.  I agree with the assessment, diagnosis, and plan of care documented in the resident's note.  

## 2020-05-24 ENCOUNTER — Ambulatory Visit: Payer: No Typology Code available for payment source

## 2020-05-31 ENCOUNTER — Ambulatory Visit: Payer: No Typology Code available for payment source

## 2020-07-11 ENCOUNTER — Encounter (HOSPITAL_COMMUNITY): Payer: Self-pay | Admitting: Emergency Medicine

## 2020-07-11 ENCOUNTER — Other Ambulatory Visit: Payer: Self-pay

## 2020-07-11 ENCOUNTER — Emergency Department (HOSPITAL_COMMUNITY)
Admission: EM | Admit: 2020-07-11 | Discharge: 2020-07-12 | Disposition: A | Discharge status: Home / Self Care | Payer: No Typology Code available for payment source | Attending: Emergency Medicine | Admitting: Emergency Medicine

## 2020-07-11 DIAGNOSIS — N390 Urinary tract infection, site not specified: Secondary | ICD-10-CM | POA: Insufficient documentation

## 2020-07-11 DIAGNOSIS — B9689 Other specified bacterial agents as the cause of diseases classified elsewhere: Secondary | ICD-10-CM | POA: Insufficient documentation

## 2020-07-11 LAB — CBC WITH DIFFERENTIAL/PLATELET
Abs Immature Granulocytes: 0.06 10*3/uL (ref 0.00–0.07)
Basophils Absolute: 0 10*3/uL (ref 0.0–0.1)
Basophils Relative: 0 %
Eosinophils Absolute: 0.2 10*3/uL (ref 0.0–0.5)
Eosinophils Relative: 2 %
HCT: 40 % (ref 36.0–46.0)
Hemoglobin: 13.1 g/dL (ref 12.0–15.0)
Immature Granulocytes: 1 %
Lymphocytes Relative: 24 %
Lymphs Abs: 2.8 10*3/uL (ref 0.7–4.0)
MCH: 28.9 pg (ref 26.0–34.0)
MCHC: 32.8 g/dL (ref 30.0–36.0)
MCV: 88.3 fL (ref 80.0–100.0)
Monocytes Absolute: 0.6 10*3/uL (ref 0.1–1.0)
Monocytes Relative: 5 %
Neutro Abs: 8.1 10*3/uL — ABNORMAL HIGH (ref 1.7–7.7)
Neutrophils Relative %: 68 %
Platelets: 355 10*3/uL (ref 150–400)
RBC: 4.53 MIL/uL (ref 3.87–5.11)
RDW: 12.7 % (ref 11.5–15.5)
WBC: 11.8 10*3/uL — ABNORMAL HIGH (ref 4.0–10.5)
nRBC: 0 % (ref 0.0–0.2)

## 2020-07-11 LAB — URINALYSIS, ROUTINE W REFLEX MICROSCOPIC
Bilirubin Urine: NEGATIVE
Glucose, UA: NEGATIVE mg/dL
Ketones, ur: NEGATIVE mg/dL
Nitrite: NEGATIVE
Protein, ur: NEGATIVE mg/dL
Specific Gravity, Urine: 1.003 — ABNORMAL LOW (ref 1.005–1.030)
WBC, UA: >50 WBC/hpf — ABNORMAL HIGH (ref 0–5)
pH: 6 (ref 5.0–8.0)

## 2020-07-11 LAB — COMPREHENSIVE METABOLIC PANEL
ALT: 27 U/L (ref 0–44)
AST: 32 U/L (ref 15–41)
Albumin: 3.7 g/dL (ref 3.5–5.0)
Alkaline Phosphatase: 100 U/L (ref 38–126)
Anion gap: 11 (ref 5–15)
BUN: 9 mg/dL (ref 6–20)
CO2: 25 mmol/L (ref 22–32)
Calcium: 8.9 mg/dL (ref 8.9–10.3)
Chloride: 101 mmol/L (ref 98–111)
Creatinine, Ser: 0.76 mg/dL (ref 0.44–1.00)
GFR, Estimated: >60 mL/min (ref >60)
Glucose, Bld: 140 mg/dL — ABNORMAL HIGH (ref 70–99)
Potassium: 3.4 mmol/L — ABNORMAL LOW (ref 3.5–5.1)
Sodium: 137 mmol/L (ref 135–145)
Total Bilirubin: 0.8 mg/dL (ref 0.3–1.2)
Total Protein: 7.4 g/dL (ref 6.5–8.1)

## 2020-07-11 NOTE — ED Provider Notes (Signed)
Emergency Medicine Provider Triage Evaluation Note  Crystal [name] , a 50 y.o. female  was evaluated in triage.  Pt complains of dysuria x2 weeks. History of chronic UTIs. Dysuria associated with back pain and chills. No known fever. Denies vaginal symptoms. No abdominal pain.   Review of Systems  Positive: dysuria Negative: fever  Physical Exam  BP 132/86 (BP Location: Left Arm)   Pulse 94   Temp 97.9 F (36.6 C) (Oral)   Resp 18   LMP 12/10/2019 (Exact Date)   SpO2 98%  Gen:   Awake, no distress   Resp:  Normal effort  MSK:   Moves extremities without difficulty  Other:  CVA tenderness  Medical Decision Making  Medically screening exam initiated at 10:00 PM.  Appropriate orders placed.  Crystal [name] was informed that the remainder of the evaluation will be completed by another provider, this initial triage assessment does not replace that evaluation, and the importance of remaining in the ED until their evaluation is complete.  Labs and UA ordered.    Crystal [name] 07/11/20 2201    Crystal Rank, MD 07/12/20 [phone]

## 2020-07-11 NOTE — ED Triage Notes (Signed)
Pt c/o pain with urination and low back pain. Hx UTIs.

## 2020-07-12 ENCOUNTER — Encounter (HOSPITAL_COMMUNITY): Payer: Self-pay | Admitting: Student

## 2020-07-12 MED ORDER — POTASSIUM CHLORIDE CRYS ER 20 MEQ PO TBCR
40.0000 meq | EXTENDED_RELEASE_TABLET | Freq: Once | ORAL | Status: AC
  Administered 2020-07-12: 08:00:00 40 meq via ORAL
  Filled 2020-07-12: qty 2

## 2020-07-12 MED ORDER — SODIUM CHLORIDE 0.9 % IV SOLN
1.0000 g | Freq: Once | INTRAVENOUS | Status: AC
  Administered 2020-07-12: 05:00:00 1 g via INTRAVENOUS
  Filled 2020-07-12: qty 10

## 2020-07-12 MED ORDER — CEFPODOXIME PROXETIL 200 MG PO TABS: 200.0000 mg | ORAL_TABLET | Freq: Two times a day (BID) | ORAL | 0 refills | Status: DC

## 2020-07-12 NOTE — Discharge Instructions (Addendum)
You were seen in the emergency department today and found to have a UTI.  We are treating this with cefpodoxime, take this twice daily for the next 10 days.  We have sent a culture to ensure that this is the proper antibiotic, we will call you if this is abnormal.    We have prescribed you new medication(s) today. Discuss the medications prescribed today with your pharmacist as they can have adverse effects and interactions with your other medicines including over the counter and prescribed medications. Seek medical evaluation if you start to experience new or abnormal symptoms after taking one of these medicines, seek care immediately if you start to experience difficulty breathing, feeling of your throat closing, facial swelling, or rash as these could be indications of a more serious allergic reaction   We would like you to follow-up with your primary care provider within 3 days.  Return to the ER for new or worsening symptoms including but not limited to vomiting, fever, increased or new pain, or any other concerns.  English to Spanish:   Hoy lo vieron en el departamento de emergencias y se descubri que tena una infeccin urinaria. Estamos tratando esto con cefpodoxima, tmelo dos veces al [employer] prximos 10 das. Hemos enviado un cultivo para asegurarnos de que este es el antibitico [place], lo llamaremos si esto es anormal.  Le hemos recetado nuevos medicamentos hoy. Hable sobre los medicamentos recetados hoy con su farmacutico, ya que pueden tener efectos adversos e interacciones con sus otros medicamentos, incluidos los medicamentos recetados y de [place]. Busque una evaluacin mdica si comienza a experimentar sntomas nuevos o anormales despus de tomar uno de estos medicamentos, busque atencin mdica de inmediato si comienza a experimentar dificultad para respirar, sensacin de que se le cierra la garganta, hinchazn facial o sarpullido, ya que estos podran ser indicios de  una enfermedad ms grave. reaccin alrgica  Nos gustara que hiciera un seguimiento con su proveedor de atencin primaria dentro de los 3 [place]. Regrese a la sala de emergencias si presenta sntomas nuevos o que empeoran, incluidos, entre otros, vmitos, [place], aumento del dolor o dolor nuevo, o cualquier otra inquietud.

## 2020-07-12 NOTE — ED Provider Notes (Signed)
**Crystal [name] De-Identified via Obfuscation** Crystal Crystal [name]   CSN: [id number] Arrival date & time: 07/11/20  2146     History Chief Complaint  Patient presents with   Dysuria   Recurrent UTI    Crystal Crystal [name] is a 50 y.o. female with a history of prediabetes, depression, and cholecystectomy as well as tubal ligation who presents to the emergency department with complaints of dysuria x 1 week. Patient reports dysuria, chills, suprapubic pressure, and lower back pain. Pain is mild, steady. Other than urinating no alleviating/aggravating factors. Feels like prior UTIs, have gotten better with tx previously. Denies fever, vomiting, diarrhea, melena, or vaginal discharge.   Interpretor utilized throughout Sales executive.   HPI     Past Medical History:  Diagnosis Date   CIN I (cervical intraepithelial neoplasia I)    Pap 02/2019: ASCUS w/ high risk HPV   Depression    Elevated LDL cholesterol level    Obesity (BMI 30-39.9)    Post-menopausal bleeding    Pre-diabetes     Patient Active Problem List   Diagnosis Date Noted   Acute cystitis without hematuria 05/07/2020   Pre-diabetes 05/07/2020   Elevated LDL cholesterol level 05/07/2020   Obesity (BMI 30-39.9) 05/07/2020   Depression 05/07/2020   Healthcare maintenance 05/07/2020   ASCUS with positive high risk HPV cervical 03/17/2020   Postmenopausal bleeding 03/17/2020    Past Surgical History:  Procedure Laterality Date   CHOLECYSTECTOMY N/A 07/28/2012   Procedure: LAPAROSCOPIC CHOLECYSTECTOMY WITH INTRAOPERATIVE CHOLANGIOGRAM;  Surgeon: Imogene Burn. Georgette Dover, MD;  Location: [facility];  Service: General;  Laterality: N/A;   TUBAL LIGATION     7 years ago     OB History     Gravida  6   Para  4   Term  4   Preterm      AB  1   Living  5      SAB      IAB      Ectopic      Multiple  1   Live Births  5           Family History  Problem Relation Age of Onset   Hypertension Mother    Diabetes  Mother    Heart disease Father    Diabetes Father    Cancer Maternal Aunt        uterine   Diabetes Sister    Diabetes Brother    Diabetes Sister    Anesthesia problems Neg Hx     Social History   Tobacco Use   Smoking status: Never   Smokeless tobacco: Never  Vaping Use   Vaping Use: Never used  Substance Use Topics   Alcohol use: No   Drug use: No    Home Medications Prior to Admission medications   Not on File    Allergies    Patient has no known allergies.  Review of Systems   Review of Systems  Constitutional:  Positive for chills. Negative for fever.  Respiratory:  Negative for shortness of breath.   Cardiovascular:  Negative for chest pain.  Gastrointestinal:  Positive for abdominal pain. Negative for blood in stool, diarrhea, nausea and vomiting.  Genitourinary:  Positive for dysuria and flank pain. Negative for vaginal bleeding and vaginal discharge.  Neurological:  Negative for syncope.  All other systems reviewed and are negative.  Physical Exam Updated Vital Signs BP 125/79 (BP Location: Left Arm)   Pulse 61   Temp 97.7 F (36.5  C) (Oral)   Resp 17   LMP 12/10/2019 (Exact Date)   SpO2 100%   Physical Exam Vitals and nursing Crystal [name] reviewed.  Constitutional:      General: She is not in acute distress.    Appearance: She is well-developed. She is not toxic-appearing.  HENT:     Head: Normocephalic and atraumatic.  Eyes:     General:        Right eye: No discharge.        Left eye: No discharge.     Conjunctiva/sclera: Conjunctivae normal.  Cardiovascular:     Rate and Rhythm: Normal rate and regular rhythm.  Pulmonary:     Effort: Pulmonary effort is normal. No respiratory distress.     Breath sounds: Normal breath sounds. No wheezing, rhonchi or rales.  Abdominal:     General: There is no distension.     Palpations: Abdomen is soft.     Tenderness: There is no abdominal tenderness. There is right CVA tenderness and left CVA tenderness.  There is no guarding or rebound.  Musculoskeletal:     Cervical back: Neck supple.     Comments: Lumbar paraspinal muscle tenderness. No point/focal vertebral tenderness.   Skin:    General: Skin is warm and dry.     Findings: No rash.  Neurological:     Mental Status: She is alert.     Comments: Clear speech.   Psychiatric:        Behavior: Behavior normal.    ED Results / Procedures / Treatments   Labs (all labs ordered are listed, but only abnormal results are displayed) Labs Reviewed  CBC WITH DIFFERENTIAL/PLATELET - Abnormal; Notable for the following components:      Result Value   WBC 11.8 (*)    Neutro Abs 8.1 (*)    All other components within normal limits  COMPREHENSIVE METABOLIC PANEL - Abnormal; Notable for the following components:   Potassium 3.4 (*)    Glucose, Bld 140 (*)    All other components within normal limits  URINALYSIS, ROUTINE W REFLEX MICROSCOPIC - Abnormal; Notable for the following components:   APPearance CLOUDY (*)    Specific Gravity, Urine 1.003 (*)    Hgb urine dipstick LARGE (*)    Leukocytes,Ua LARGE (*)    WBC, UA >50 (*)    Bacteria, UA RARE (*)    All other components within normal limits    EKG None  Radiology No results found.  Procedures Procedures   Medications Ordered in ED Medications - No data to display  ED Course  I have reviewed the triage vital signs and the nursing notes.  Pertinent labs & imaging results that were available during my care of the patient were reviewed by me and considered in my medical decision making (see chart for details).    MDM Rules/Calculators/A&P                          Patient presents to the ED with complaints of dysuria, nontoxic, vitals without significant abnormality.   Additional history obtained:  Additional history obtained from chart review & nursing Crystal [name] review.  ED visit in March 2022, treated for UTI with Keflex twice daily. PCP visit in April 2022 treated with 3  days of Bactrim twice daily. Lab Tests:  I reviewed and interpreted labs, which included:  CBC: mild leukocytosis.  CMP: Mild hypokalemia.  UA: concerning for infection.   ED Course:  Serial abdominal exams w/o peritoneal signs- do not suspect acute surgical process.  No vaginal discharge or concern for STI.  Steady mild pain, does not seem colicky to raise concern for kidney stone.   Recurrent UTIs previously. Nontoxic, not septic appearing. Will cover for pyelonephritis given back pain /CVA tenderness and this being her 3rd UTI within the past few months. Culture sent, no recent on record to help with abx selection, will give rocephin in the ED & continue cephalosporin outpatient. Will need PCP follow up. I discussed results, treatment plan, need for follow-up, and return precautions with the patient. Provided opportunity for questions, patient confirmed understanding and is in agreement with plan.   Portions of this Crystal [name] were generated with Lobbyist. Dictation errors may occur despite best attempts at proofreading.  Final Clinical Impression(s) / ED Diagnoses Final diagnoses:  Acute UTI    Rx / DC Orders ED Discharge Orders          Ordered    cefpodoxime (VANTIN) 200 MG tablet  2 times daily        07/12/20 0656             Amaryllis Dyke, PA-C 07/12/20 7782    Tegeler, Gwenyth Allegra, MD 07/12/20 [phone]

## 2020-07-13 LAB — URINE CULTURE: Culture: NO GROWTH

## 2020-07-31 ENCOUNTER — Encounter: Payer: Self-pay | Admitting: *Deleted

## 2020-09-25 ENCOUNTER — Other Ambulatory Visit: Payer: Self-pay

## 2020-09-25 ENCOUNTER — Emergency Department (HOSPITAL_COMMUNITY)
Admission: EM | Admit: 2020-09-25 | Discharge: 2020-09-26 | Disposition: A | Discharge status: Home / Self Care | Payer: Managed Care, Other (non HMO) | Attending: Emergency Medicine | Admitting: Emergency Medicine

## 2020-09-25 ENCOUNTER — Encounter (HOSPITAL_COMMUNITY): Payer: Self-pay | Admitting: Emergency Medicine

## 2020-09-25 DIAGNOSIS — I743 Embolism and thrombosis of arteries of the lower extremities: Secondary | ICD-10-CM | POA: Insufficient documentation

## 2020-09-25 DIAGNOSIS — J36 Peritonsillar abscess: Secondary | ICD-10-CM

## 2020-09-25 DIAGNOSIS — J020 Streptococcal pharyngitis: Secondary | ICD-10-CM

## 2020-09-25 DIAGNOSIS — J029 Acute pharyngitis, unspecified: Secondary | ICD-10-CM | POA: Diagnosis present

## 2020-09-25 LAB — GROUP A STREP BY PCR: Group A Strep by PCR: DETECTED — AB

## 2020-09-25 LAB — CBC WITH DIFFERENTIAL/PLATELET
Abs Immature Granulocytes: 0.06 10*3/uL (ref 0.00–0.07)
Basophils Absolute: 0 10*3/uL (ref 0.0–0.1)
Basophils Relative: 0 %
Eosinophils Absolute: 0.2 10*3/uL (ref 0.0–0.5)
Eosinophils Relative: 2 %
HCT: 42.1 % (ref 36.0–46.0)
Hemoglobin: 13.6 g/dL (ref 12.0–15.0)
Immature Granulocytes: 1 %
Lymphocytes Relative: 27 %
Lymphs Abs: 2.4 10*3/uL (ref 0.7–4.0)
MCH: 28.4 pg (ref 26.0–34.0)
MCHC: 32.3 g/dL (ref 30.0–36.0)
MCV: 87.9 fL (ref 80.0–100.0)
Monocytes Absolute: 0.5 10*3/uL (ref 0.1–1.0)
Monocytes Relative: 5 %
Neutro Abs: 5.9 10*3/uL (ref 1.7–7.7)
Neutrophils Relative %: 65 %
Platelets: 379 10*3/uL (ref 150–400)
RBC: 4.79 MIL/uL (ref 3.87–5.11)
RDW: 13.4 % (ref 11.5–15.5)
WBC: 9.1 10*3/uL (ref 4.0–10.5)
nRBC: 0 % (ref 0.0–0.2)

## 2020-09-25 LAB — BASIC METABOLIC PANEL
Anion gap: 6 (ref 5–15)
BUN: 11 mg/dL (ref 6–20)
CO2: 27 mmol/L (ref 22–32)
Calcium: 9 mg/dL (ref 8.9–10.3)
Chloride: 106 mmol/L (ref 98–111)
Creatinine, Ser: 0.65 mg/dL (ref 0.44–1.00)
GFR, Estimated: >60 mL/min (ref >60)
Glucose, Bld: 115 mg/dL — ABNORMAL HIGH (ref 70–99)
Potassium: 3.7 mmol/L (ref 3.5–5.1)
Sodium: 139 mmol/L (ref 135–145)

## 2020-09-25 LAB — I-STAT BETA HCG BLOOD, ED (MC, WL, AP ONLY): I-stat hCG, quantitative: <5 m[IU]/mL (ref <5)

## 2020-09-25 NOTE — ED Provider Notes (Signed)
Emergency Medicine Provider Triage Evaluation Note  Crystal [name] , a 50 y.o. female  was evaluated in triage.  Pt complains of sore throat for several weeks. Seen by pcp pta and sent here for ct neck due to concern for pta.  Review of Systems  Positive: Sore throat Negative: drooling  Physical Exam  BP 117/85   Pulse 95   Temp 99 F (37.2 C) (Oral)   Resp 14   LMP 12/10/2019 (Exact Date)   SpO2 96%  Gen:   Awake, no distress   Resp:  Normal effort  MSK:   Moves extremities without difficulty  Other:  Tonsillar edema, R>L  Medical Decision Making  Medically screening exam initiated at 5:04 PM.  Appropriate orders placed.  Shaunice Fosnight was informed that the remainder of the evaluation will be completed by another provider, this initial triage assessment does not replace that evaluation, and the importance of remaining in the ED until their evaluation is complete.     Rodney Booze, PA-C 09/25/20 [place], [place], DO 09/25/20 2246

## 2020-09-25 NOTE — ED Triage Notes (Signed)
Pt c/o sore throat x 3 weeks.

## 2020-09-26 MED ORDER — DEXAMETHASONE SODIUM PHOSPHATE 10 MG/ML IJ SOLN
10.0000 mg | Freq: Once | INTRAMUSCULAR | Status: AC
  Administered 2020-09-26: 10 mg via INTRAMUSCULAR
  Filled 2020-09-26: qty 1

## 2020-09-26 MED ORDER — PENICILLIN G BENZATHINE 1200000 UNIT/2ML IM SUSY
1.2000 10*6.[IU] | PREFILLED_SYRINGE | Freq: Once | INTRAMUSCULAR | Status: AC
  Administered 2020-09-26: 1.2 10*6.[IU] via INTRAMUSCULAR
  Filled 2020-09-26: qty 2

## 2020-09-26 MED ORDER — METRONIDAZOLE 500 MG PO TABS
500.0000 mg | ORAL_TABLET | Freq: Once | ORAL | Status: AC
  Administered 2020-09-26: 500 mg via ORAL
  Filled 2020-09-26: qty 1

## 2020-09-26 MED ORDER — METRONIDAZOLE 500 MG PO TABS: 500.0000 mg | ORAL_TABLET | Freq: Two times a day (BID) | ORAL | 0 refills | Status: DC

## 2020-09-26 MED ORDER — KETOROLAC TROMETHAMINE 30 MG/ML IJ SOLN
15.0000 mg | Freq: Once | INTRAMUSCULAR | Status: AC
  Administered 2020-09-26: 15 mg via INTRAVENOUS
  Filled 2020-09-26: qty 1

## 2020-09-26 NOTE — ED Provider Notes (Signed)
[facility] EMERGENCY DEPARTMENT Provider Note   CSN: [id number] Arrival date & time: 09/25/20  1648     History Chief Complaint  Patient presents with   Sore Throat    Crystal [name] is a 50 y.o. female.  Patient to ED for evaluation of sore throat affecting the right side that started 2 weeks ago. She reports at that time she had a fever but no fever since. No vomiting, diarrhea, congestion or cough. No known sick contacts. She reports she was sent by her PCP for evaluation of possible peritonsillar abscess. Chart reviewed: per note of her doctor the patient reported being able to express yellow pus from the back of her throat.    Sore Throat Pertinent negatives include no abdominal pain, no headaches and no shortness of breath.      Past Medical History:  Diagnosis Date   CIN I (cervical intraepithelial neoplasia I)    Pap 02/2019: ASCUS w/ high risk HPV   Depression    Elevated LDL cholesterol level    Obesity (BMI 30-39.9)    Post-menopausal bleeding    Pre-diabetes     Patient Active Problem List   Diagnosis Date Noted   Acute cystitis without hematuria 05/07/2020   Pre-diabetes 05/07/2020   Elevated LDL cholesterol level 05/07/2020   Obesity (BMI 30-39.9) 05/07/2020   Depression 05/07/2020   Healthcare maintenance 05/07/2020   ASCUS with positive high risk HPV cervical 03/17/2020   Postmenopausal bleeding 03/17/2020    Past Surgical History:  Procedure Laterality Date   CHOLECYSTECTOMY N/A 07/28/2012   Procedure: LAPAROSCOPIC CHOLECYSTECTOMY WITH INTRAOPERATIVE CHOLANGIOGRAM;  Surgeon: Imogene Burn. Georgette Dover, MD;  Location: [facility];  Service: General;  Laterality: N/A;   TUBAL LIGATION     7 years ago     OB History     Gravida  6   Para  4   Term  4   Preterm      AB  1   Living  5      SAB      IAB      Ectopic      Multiple  1   Live Births  5           Family History  Problem Relation Age of Onset    Hypertension Mother    Diabetes Mother    Heart disease Father    Diabetes Father    Cancer Maternal Aunt        uterine   Diabetes Sister    Diabetes Brother    Diabetes Sister    Anesthesia problems Neg Hx     Social History   Tobacco Use   Smoking status: Never   Smokeless tobacco: Never  Vaping Use   Vaping Use: Never used  Substance Use Topics   Alcohol use: No   Drug use: No    Home Medications Prior to Admission medications   Medication Sig Start Date End Date Taking? Authorizing Provider  cefpodoxime (VANTIN) 200 MG tablet Take 1 tablet (200 mg total) by mouth 2 (two) times daily. 07/12/20   Petrucelli, Glynda Jaeger, PA-C    Allergies    Patient has no known allergies.  Review of Systems   Review of Systems  Constitutional:  Positive for fever (2 weeks ago at symptom onset but none since).  HENT:  Positive for sore throat. Negative for congestion and sinus pressure.   Respiratory:  Negative for cough and shortness of breath.   Gastrointestinal:  Negative for abdominal pain, nausea and vomiting.  Skin:  Negative for color change.  Neurological:  Negative for headaches.   Physical Exam Updated Vital Signs BP (!) 139/91   Pulse 85   Temp 98.6 F (37 C) (Oral)   Resp 18   LMP 12/10/2019 (Exact Date)   SpO2 100%   Physical Exam Vitals and nursing note reviewed.  Constitutional:      General: She is not in acute distress.    Appearance: She is well-developed.  HENT:     Head: Normocephalic.     Right Ear: Tympanic membrane normal.     Left Ear: Tympanic membrane normal.     Mouth/Throat:     Mouth: Mucous membranes are moist.     Pharynx: Uvula midline.     Comments: No visualized oropharyngeal abscess.  Cardiovascular:     Rate and Rhythm: Normal rate and regular rhythm.     Heart sounds: No murmur heard. Pulmonary:     Effort: Pulmonary effort is normal.     Breath sounds: Normal breath sounds. No wheezing, rhonchi or rales.  Abdominal:      General: Bowel sounds are normal.     Palpations: Abdomen is soft.     Tenderness: There is no abdominal tenderness. There is no guarding or rebound.  Musculoskeletal:        General: Normal range of motion.     Cervical back: Normal range of motion and neck supple.  Skin:    General: Skin is warm and dry.  Neurological:     General: No focal deficit present.     Mental Status: She is alert and oriented to person, place, and time.    ED Results / Procedures / Treatments   Labs (all labs ordered are listed, but only abnormal results are displayed) Labs Reviewed  GROUP A STREP BY PCR - Abnormal; Notable for the following components:      Result Value   Group A Strep by PCR DETECTED (*)    All other components within normal limits  BASIC METABOLIC PANEL - Abnormal; Notable for the following components:   Glucose, Bld 115 (*)    All other components within normal limits  CBC WITH DIFFERENTIAL/PLATELET  I-STAT BETA HCG BLOOD, ED ([facility], [facility], [facility] ONLY)    EKG None  Radiology No results found.  Procedures Procedures   Medications Ordered in ED Medications - No data to display  ED Course  I have reviewed the triage vital signs and the nursing notes.  Pertinent labs & imaging results that were available during my care of the patient were reviewed by me and considered in my medical decision making (see chart for details).    MDM Rules/Calculators/A&P                           Patient to ED with right sided sore throat for the past 2 weeks, sent to r/o PTA by PCP. No fever on arrival. She is requesting to eat and drink.   Labs done and patient is strep positive. No other significant lab abnormalities noted.   The patient is seen by Dr. Leonette Monarch who feels there is presence of PTA on the right but imaging is not indicated. IM Bicillin-LA provided after discussed with the patient. Per pharmacy, add Flagyl 500 mg BID. Decadron and Toradol also provided. Will refer to ENT for any  persistent/worsening symptoms. Of note, per review of patient's recent PCP  in office visit, pt reports being able to express pus from the area suggesting no I&D is needed.    Final Clinical Impression(s) / ED Diagnoses Final diagnoses:  None   Strep throat Right PTA  Rx / DC Orders ED Discharge Orders     None        Dennie Bible 09/26/20 0239    Fatima Blank, MD 09/27/20 [phone]

## 2020-09-26 NOTE — Discharge Instructions (Addendum)
Haga un seguimiento con el Dr. Redmond Baseman si su dolor no mejora en 2 o 3 das. Regrese al departamento de emergencias si tiene alguna dificultad para tragar, fiebre alta, dolor intenso o una nueva inquietud.  Le dieron un antibitico en forma de inyeccin. Nuestro farmacutico tambin recomienda tomar una segunda durante los prximos 10 das. Esto fue llamado a su farmacia.  Follow up with Dr. Redmond Baseman if your pain is not any better in 2-3 days. Return to the emergency department if you have any difficulty swallowing, high fever, severe pain or new concern.  You were given one antibiotic as an injection. Our pharmacist also recommends a second one to be taken for the nxt 10 days. This was called to your pharmacy.

## 2020-09-26 NOTE — ED Notes (Signed)
Discharge instructions discussed with pt. Pt verbalized understanding. Pt stable and ambulatory. No signature pad available. 

## 2021-02-07 ENCOUNTER — Other Ambulatory Visit: Payer: Self-pay

## 2021-02-07 ENCOUNTER — Emergency Department (HOSPITAL_COMMUNITY)
Admission: EM | Admit: 2021-02-07 | Discharge: 2021-02-07 | Disposition: A | Discharge status: Home / Self Care | Payer: Managed Care, Other (non HMO) | Attending: Student | Admitting: Student

## 2021-02-07 ENCOUNTER — Encounter (HOSPITAL_COMMUNITY): Payer: Self-pay | Admitting: Emergency Medicine

## 2021-02-07 DIAGNOSIS — J069 Acute upper respiratory infection, unspecified: Secondary | ICD-10-CM | POA: Diagnosis not present

## 2021-02-07 DIAGNOSIS — U071 COVID-19: Secondary | ICD-10-CM | POA: Insufficient documentation

## 2021-02-07 DIAGNOSIS — R059 Cough, unspecified: Secondary | ICD-10-CM | POA: Diagnosis present

## 2021-02-07 LAB — RESP PANEL BY RT-PCR (FLU A&B, COVID) ARPGX2
Influenza A by PCR: NEGATIVE
Influenza B by PCR: NEGATIVE
SARS Coronavirus 2 by RT PCR: POSITIVE — AB

## 2021-02-07 NOTE — ED Triage Notes (Signed)
Pt reports nasal congestion since Friday.  Pt has only taken Tylenol at home.

## 2021-02-07 NOTE — Discharge Instructions (Signed)
Es probable que sus sntomas sean causados por una infeccin viral de las vas respiratorias superiores. Los antibiticos no son tiles en el tratamiento de la infeccin viral, el virus debe seguir su curso en aproximadamente 5 a 7 das. Puede ver los [place] de COVID y gripe en lnea a travs de Pharmacist, community. Por favor, asegrese de beber muchos lquidos. Puede tratar sus sntomas de apoyo con tylenol/ibuprofeno para la fiebre y [place], [place], Zyrtec y Flonase para ayudar con la congestin nasal, y jarabes para la tos y pastillas para la garganta de venta libre para ayudar con la tos. Si sus sntomas no mejoran, haga un seguimiento con su mdico de cabecera. Si desarrolla fiebre persistente, falta de aire o dificultad para respirar, dolor en el pecho, dolor de cabeza intenso y dolor de cuello, nuseas y vmitos persistentes u otros sntomas nuevos o preocupantes, regrese al departamento de Multimedia programmer.    Your symptoms are likely caused by a viral upper respiratory infection. Antibiotics are not helpful in treating viral infection, the virus should run its course in about 5-7 days.  You can view COVID and flu results online through [place].  Please make sure you are drinking plenty of fluids. You can treat your symptoms supportively with tylenol/ibuprofen for fevers and pains, Sudafed, Zyrtec and Flonase to help with nasal congestion, and over the counter cough syrups and throat lozenges to help with cough. If your symptoms are not improving please follow up with you Primary doctor.   If you develop persistent fevers, shortness of breath or difficulty breathing, chest pain, severe headache and neck pain, persistent nausea and vomiting or other new or concerning symptoms return to the Emergency department.

## 2021-02-07 NOTE — ED Provider Notes (Signed)
[facility] EMERGENCY DEPARTMENT Provider Note   CSN: [id number] Arrival date & time: 02/07/21  0115     History  Chief Complaint  Patient presents with   Nasal Congestion   Cough    Crystal [name] is a 51 y.o. female.  Crystal [name] is a 51 y.o. female who presents for evaluation of 6 days of cough and congestion.  Patient reports she has had chills but has not had measured fever at home.  She reports that she has had a dry nonproductive cough and severe worsening nasal congestion.  She reports that her nasal congestion has made it difficult for her to breathe through her nose at night and has kept her from sleeping.  No associated chest pain or shortness of breath.  No nausea, vomiting, diarrhea or abdominal pain.  She reports that one of her coworkers was sick with similar symptoms but she is unsure if they were tested for COVID or flu.  No other known sick contacts.  Taking Tylenol at home without improvement.  Has not tried anything else.  The history is provided by the patient and medical records.      Home Medications Prior to Admission medications   Medication Sig Start Date End Date Taking? Authorizing Provider  cefpodoxime (VANTIN) 200 MG tablet Take 1 tablet (200 mg total) by mouth 2 (two) times daily. 07/12/20   Petrucelli, Samantha R, PA-C  metroNIDAZOLE (FLAGYL) 500 MG tablet Take 1 tablet (500 mg total) by mouth 2 (two) times daily. 09/26/20   Charlann Lange, PA-C      Allergies    Patient has no known allergies.    Review of Systems   Review of Systems  Constitutional:  Positive for chills and fever.  HENT:  Positive for congestion and rhinorrhea. Negative for sore throat.   Respiratory:  Positive for cough. Negative for shortness of breath.   Cardiovascular:  Negative for chest pain.  Gastrointestinal:  Negative for abdominal pain, diarrhea, nausea and vomiting.  Musculoskeletal:  Negative for myalgias, neck pain and neck  stiffness.  Skin:  Negative for color change and rash.  Neurological:  Positive for headaches.  All other systems reviewed and are negative.  Physical Exam Updated Vital Signs BP 116/90 (BP Location: Left Arm)    Pulse 75    Temp 97.7 F (36.5 C) (Oral)    Resp 18    LMP 12/10/2019 (Exact Date)    SpO2 100%  Physical Exam Vitals and nursing note reviewed.  Constitutional:      General: She is not in acute distress.    Appearance: She is well-developed. She is not ill-appearing or diaphoretic.  HENT:     Head: Normocephalic and atraumatic.     Nose: Congestion and rhinorrhea present.     Mouth/Throat:     Mouth: Mucous membranes are moist.     Pharynx: Oropharynx is clear.  Eyes:     General:        Right eye: No discharge.        Left eye: No discharge.  Neck:     Comments: No rigidity Cardiovascular:     Rate and Rhythm: Normal rate and regular rhythm.     Heart sounds: Normal heart sounds. No murmur heard.   No friction rub. No gallop.  Pulmonary:     Effort: Pulmonary effort is normal. No respiratory distress.     Breath sounds: Normal breath sounds.     Comments: Respirations equal and unlabored, patient  able to speak in full sentences, lungs clear to auscultation bilaterally  Abdominal:     General: Bowel sounds are normal. There is no distension.     Palpations: Abdomen is soft. There is no mass.     Tenderness: There is no abdominal tenderness. There is no guarding.     Comments: Abdomen soft, nondistended, nontender to palpation in all quadrants without guarding or peritoneal signs  Musculoskeletal:        General: No deformity.     Cervical back: Neck supple.  Lymphadenopathy:     Cervical: No cervical adenopathy.  Skin:    General: Skin is warm and dry.     Capillary Refill: Capillary refill takes less than 2 seconds.  Neurological:     Mental Status: She is alert and oriented to person, place, and time.  Psychiatric:        Mood and Affect: Mood normal.         Behavior: Behavior normal.    ED Results / Procedures / Treatments   Labs (all labs ordered are listed, but only abnormal results are displayed) Labs Reviewed  RESP PANEL BY RT-PCR (FLU A&B, COVID) [id number]    EKG None  Radiology No results found.  Procedures Procedures    Medications Ordered in ED Medications - No data to display  ED Course/ Medical Decision Making/ A&P                           Medical Decision Making  Pt presents with nasal congestion and cough. Pt is well appearing and vitals are normal.  Suspect this is likely COVID, flu or other viral illness, since symptoms have only been present for 5-6 days do not think this is bacterial sinusitis.  Lungs are clear to auscultation, cough is nonproductive and patient without tachycardia, tachypnea or hypoxia, consider chest x-ray but do not feel this is indicated.  Patients symptoms are consistent with URI, likely viral etiology.  COVID and flu testing sent which patient will review results of online.  Discussed that antibiotics are not indicated for viral infections. Pt will be discharged with symptomatic treatment.  Verbalizes understanding and is agreeable with plan. Pt is hemodynamically stable & in NAD prior to dc. Return precautions discussed, pt expresses understanding and agrees with plan.         Final Clinical Impression(s) / ED Diagnoses Final diagnoses:  Viral URI with cough    Rx / DC Orders ED Discharge Orders     None         Jacqlyn Larsen, [place] 02/07/21 0329    Margette Fast, MD 02/09/21 [phone]

## 2021-02-18 ENCOUNTER — Other Ambulatory Visit: Payer: Self-pay

## 2021-02-18 DIAGNOSIS — Z1231 Encounter for screening mammogram for malignant neoplasm of breast: Secondary | ICD-10-CM

## 2021-02-27 ENCOUNTER — Telehealth: Payer: Self-pay

## 2021-02-27 NOTE — Telephone Encounter (Signed)
Left message for patient via Cyndee Brightly about completing [facility] 3 for the [facility] program. Left name and number for patient to call back.

## 2021-04-11 ENCOUNTER — Ambulatory Visit: Payer: No Typology Code available for payment source

## 2021-04-24 ENCOUNTER — Other Ambulatory Visit: Payer: No Typology Code available for payment source

## 2021-04-24 ENCOUNTER — Ambulatory Visit: Payer: No Typology Code available for payment source

## 2021-05-29 ENCOUNTER — Telehealth: Payer: Self-pay

## 2021-05-29 NOTE — Telephone Encounter (Signed)
VM left returning phone call. Pt was not called by Center for [employer]. Appt scheduled with [facility]CP tomorrow, will forward to their office.  ?

## 2021-05-30 ENCOUNTER — Ambulatory Visit: Payer: Self-pay | Admitting: *Deleted

## 2021-05-30 ENCOUNTER — Ambulatory Visit
Admission: RE | Admit: 2021-05-30 | Discharge: 2021-05-30 | Disposition: A | Discharge status: Home / Self Care | Payer: No Typology Code available for payment source | Source: Ambulatory Visit | Attending: Obstetrics and Gynecology | Admitting: Obstetrics and Gynecology

## 2021-05-30 VITALS — BP 116/78 | Wt 190.6 lb

## 2021-05-30 DIAGNOSIS — Z1231 Encounter for screening mammogram for malignant neoplasm of breast: Secondary | ICD-10-CM

## 2021-05-30 DIAGNOSIS — Z01419 Encounter for gynecological examination (general) (routine) without abnormal findings: Secondary | ICD-10-CM

## 2021-05-30 NOTE — Progress Notes (Signed)
[name] is a 51 y.o. [id number] female who presents to [facility] clinic today with no complaints.  ?  ?Pap Smear: Pap smear completed today. 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 Per patient has no history of an abnormal Pap smear prior to her most recent Pap smear. Last Pap smear result is available in Epic.  ?  ?Physical exam: ?Breasts ?Breasts symmetrical. No skin abnormalities bilateral breasts. No nipple retraction bilateral breasts. No nipple discharge bilateral breasts. No lymphadenopathy. No lumps palpated bilateral breasts. No complaints of pain or tenderness on exam.     ? ?MS DIGITAL SCREENING TOMO BILATERAL ? ?Result Date: 03/16/2020 ?CLINICAL DATA:  Screening. EXAM: DIGITAL SCREENING BILATERAL MAMMOGRAM WITH TOMOSYNTHESIS AND CAD TECHNIQUE: Bilateral screening digital craniocaudal and mediolateral oblique mammograms were obtained. Bilateral screening digital breast tomosynthesis was performed. The images were evaluated with computer-aided detection. COMPARISON:  Previous exam(s). ACR Breast Density Category c: The breast tissue is heterogeneously dense, which may obscure small masses. FINDINGS: There are no findings suspicious for malignancy. IMPRESSION: No mammographic evidence of malignancy. A result letter of this screening mammogram will be mailed directly to the patient. RECOMMENDATION: Screening mammogram in one year. (Code:SM-B-01Y) BI-RADS CATEGORY  1: Negative. Electronically Signed   By: Nolon Nations M.D.   On: 03/16/2020 13:47  ? ?MS DIGITAL SCREENING TOMO BILATERAL ? ?Result Date: 03/08/2019 ?CLINICAL DATA:  Screening. EXAM: DIGITAL SCREENING BILATERAL MAMMOGRAM WITH TOMO AND CAD COMPARISON:  Previous exam(s). ACR Breast Density Category c: The breast tissue is heterogeneously dense, which may obscure small masses. FINDINGS: There are no findings  suspicious for malignancy. Images were processed with CAD. IMPRESSION: No mammographic evidence of malignancy. A result letter of this screening mammogram will be mailed directly to the patient. RECOMMENDATION: Screening mammogram in one year. (Code:SM-B-01Y) BI-RADS CATEGORY  1: Negative. Electronically Signed   By: Audie Pinto M.D.   On: 03/08/2019 12:06  ? ?MS DIGITAL DIAG TOMO BILAT ? ?Result Date: 02/19/2018 ?CLINICAL DATA:  51 year old patient with palpable area of concern in the 3 o'clock region of left breast. She has palpated a possible lump for approximately 1 month. A Spanish interpreter was present today during the patient's visit to assist with giving results and answering the patient's questions. EXAM: DIGITAL DIAGNOSTIC BILATERAL MAMMOGRAM WITH CAD AND TOMO ULTRASOUND LEFT BREAST COMPARISON:  Previous exam(s). ACR Breast Density Category c: The breast tissue is heterogeneously dense, which may obscure small masses. FINDINGS: Metallic skin marker placed in the region of patient concern 3 o'clock left breast. The parenchymal pattern of both breasts is stable. No mass, distortion, or suspicious microcalcification is identified in either breast to suggest malignancy. Mammographic images were processed with CAD. On physical exam, normal tissue is palpated in the outer left breast 3 o'clock region. I do not palpate a discrete lump thickening. The skin appears normal. Targeted ultrasound is performed, showing normal fibroglandular tissue in the outer left breast in the region of patient concern. IMPRESSION: No evidence of malignancy in either breast. RECOMMENDATION: Screening mammogram in one year.(Code:SM-B-01Y) I have discussed the findings and recommendations with the patient. Results were also provided in writing at the conclusion of the visit. If applicable, a reminder letter will be sent to the patient regarding the next appointment. BI-RADS CATEGORY  1: Negative. Electronically Signed   By: Curlene Dolphin M.D.   On: 02/19/2018 10:28  ? ?  MS DIGITAL DIAG TOMO UNI LEFT ? ?Result Date: 09/20/2019 ?CLINICAL DATA:  51 yr old female presenting with diffuse intermittent pain throughout the lateral aspect of the left breast. This has been going on for approximately 1 month. EXAM: DIGITAL DIAGNOSTIC UNILATERAL LEFT MAMMOGRAM WITH TOMO AND CAD COMPARISON:  Previous exam(s). ACR Breast Density Category d: The breast tissue is extremely dense, which lowers the sensitivity of mammography. FINDINGS: Full field tomosynthesis views of the left breast were performed. There is no new abnormality in the left breast to explain the patient's diffuse pain. No suspicious mass, distortion, or microcalcifications are identified to suggest presence of malignancy. Mammographic images were processed with CAD. IMPRESSION: No mammographic evidence of malignancy or other finding in the left breast to explain the patient's diffuse breast pain. RECOMMENDATION: 1. Clinical follow-up as needed for the left breast pain. We discussed some of the issues regarding breast pain, including limiting caffeine, wearing adequate support, over-the-counter pain medication, low-fat diet, exercise, and ice as needed. 2. Return for annual screening mammography which will be due in February 2022. I have discussed the findings and recommendations with the patient. If applicable, a reminder letter will be sent to the patient regarding the next appointment. BI-RADS CATEGORY  1: Negative. Electronically Signed   By: Audie Pinto M.D.   On: 09/20/2019 14:29   ? ?Pelvic/Bimanual ?Ext Genitalia ?No lesions, no swelling and no discharge observed on external genitalia.      ?  ?Vagina ?Vagina pink and normal texture. No lesions or discharge observed in vagina.      ?  ?Cervix ?Cervix is present. Cervix pink and of normal texture. No discharge observed.  ?  ?Uterus ?Uterus is present and palpable. Uterus in normal position and normal size.      ?  ?Adnexae ?Bilateral  ovaries present and palpable. No tenderness on palpation.       ?  ?Rectovaginal ?No rectal exam completed today since patient had no rectal complaints. No skin abnormalities observed on exam.   ?  ?Smoking History: ?Patient has never smoked. ?  ?Patient Navigation: ?Patient education provided. Access to services provided for patient through [place] program. Spanish interpreter Crystal [name] from [facility] provided.  ? ?Colorectal Cancer Screening: ?Per patient has had colonoscopy completed on 12/12/2020 at [facility].  No complaints today.  ?  ?Breast and Cervical Cancer Risk Assessment: ?Patient does not have family history of breast cancer, known genetic mutations, or radiation treatment to the chest before age 40. Patient has history of cervical dysplasia. Patient has no history of being immunocompromised or DES exposure in-utero. ? ?Risk Assessment   ? ? Risk Scores   ? ?   05/30/2021 03/13/2020  ? Last edited by: Crystal Revel, LPN [place], Crystal Lemon, LPN  ? 5-year risk: 0.6 % 0.6 %  ? Lifetime risk: 5.7 % 5.8 %  ? ?  ?  ? ?  ? ? ?A: ?BCCCP exam with pap smear ?No complaints. ? ?P: ?Referred patient to the [facility] for a screening mammogram on mobile unit. Appointment scheduled Thursday, May 30, 2021 at 1400. ? ?Crystal Parish, RN ?05/30/2021 1:23 PM   ?

## 2021-05-30 NOTE — Patient Instructions (Signed)
Explained breast self awareness with Crystal [name]. Pap smear completed today. Let her know if today's Pap smear is normal and HPV negative that her next Pap smear will be due in one year. Referred patient to the [facility] for a screening mammogram on mobile unit. Appointment scheduled Thursday, May 30, 2021 at 1400. Patient aware of appointment and will be there. Let patient know will follow up with her within the next couple weeks with results with result of her Pap smear by phone. Informed patient that the [facility] will follow up with her within the next couple of weeks with results of her mammogram by letter or phone. Crystal [name] verbalized understanding. ? ?Yulia Ulrich, Arvil Chaco, RN ?1:23 PM ? ? ? ? ?

## 2021-06-03 ENCOUNTER — Telehealth: Payer: Self-pay

## 2021-06-03 LAB — CYTOLOGY - PAP
Comment: NEGATIVE
Diagnosis: NEGATIVE
Diagnosis: REACTIVE
High risk HPV: NEGATIVE

## 2021-06-03 NOTE — Telephone Encounter (Signed)
Via Lavon Paganini, Spanish Interpreter [facility]), Patient informed negative Pap/HPV results, next pap due in 1 year. Patient verbalized understanding.  ?

## 2021-07-22 ENCOUNTER — Other Ambulatory Visit: Payer: Self-pay

## 2021-07-22 ENCOUNTER — Inpatient Hospital Stay: Payer: Self-pay | Attending: Obstetrics and Gynecology | Admitting: *Deleted

## 2021-07-22 VITALS — BP 132/88 | Ht 64.0 in | Wt 194.0 lb

## 2021-07-22 DIAGNOSIS — Z0000 Encounter for general adult medical examination without abnormal findings: Secondary | ICD-10-CM

## 2021-07-23 LAB — LIPID PANEL
Chol/HDL Ratio: 3.2 ratio (ref 0.0–4.4)
Cholesterol, Total: 186 mg/dL (ref 100–199)
HDL: 58 mg/dL (ref >39)
LDL Chol Calc (NIH): 103 mg/dL — ABNORMAL HIGH (ref 0–99)
Triglycerides: 144 mg/dL (ref 0–149)
VLDL Cholesterol Cal: 25 mg/dL (ref 5–40)

## 2021-07-23 LAB — HEMOGLOBIN A1C
Est. average glucose Bld gHb Est-mCnc: 123 mg/dL
Hgb A1c MFr Bld: 5.9 % — ABNORMAL HIGH (ref 4.8–5.6)

## 2021-07-23 LAB — GLUCOSE, RANDOM: Glucose: 117 mg/dL — ABNORMAL HIGH (ref 70–99)

## 2021-07-29 ENCOUNTER — Telehealth: Payer: Self-pay

## 2021-07-29 NOTE — Telephone Encounter (Signed)
Left message for patient via [facility] Interpreters # [phone] about lab results from [facility]. Left name and number for patient to call back.

## 2021-07-30 IMAGING — MG DIGITAL SCREENING BILAT W/ TOMO W/ CAD
8 series · 9 of 24 positions shown · non-contrast
Comparison: Previous exam(s).

CLINICAL DATA: Screening.

EXAM:
DIGITAL SCREENING BILATERAL MAMMOGRAM WITH TOMO AND CAD

[R MLO synth-2D]
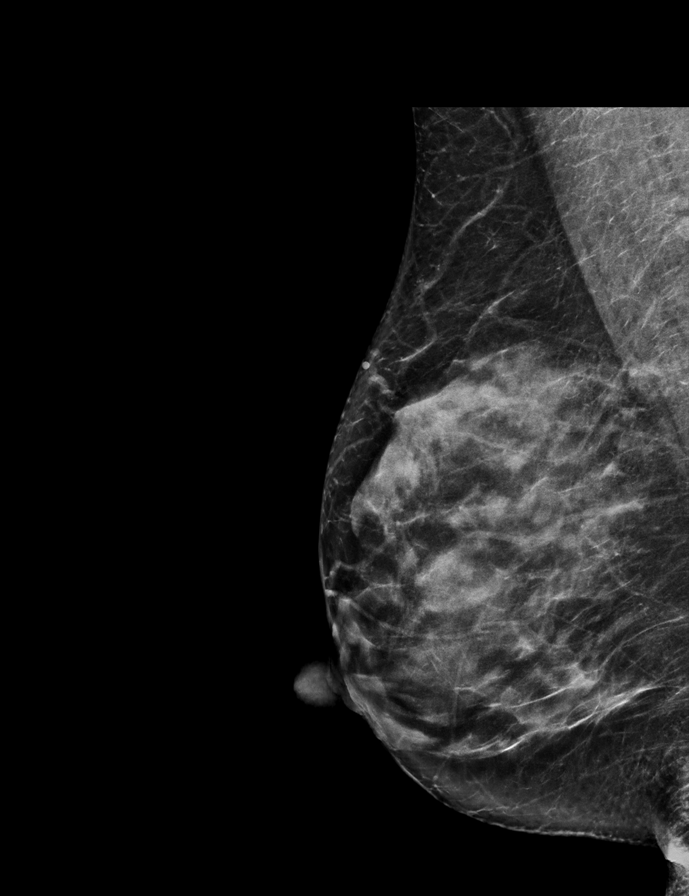

[R CC synth-2D]
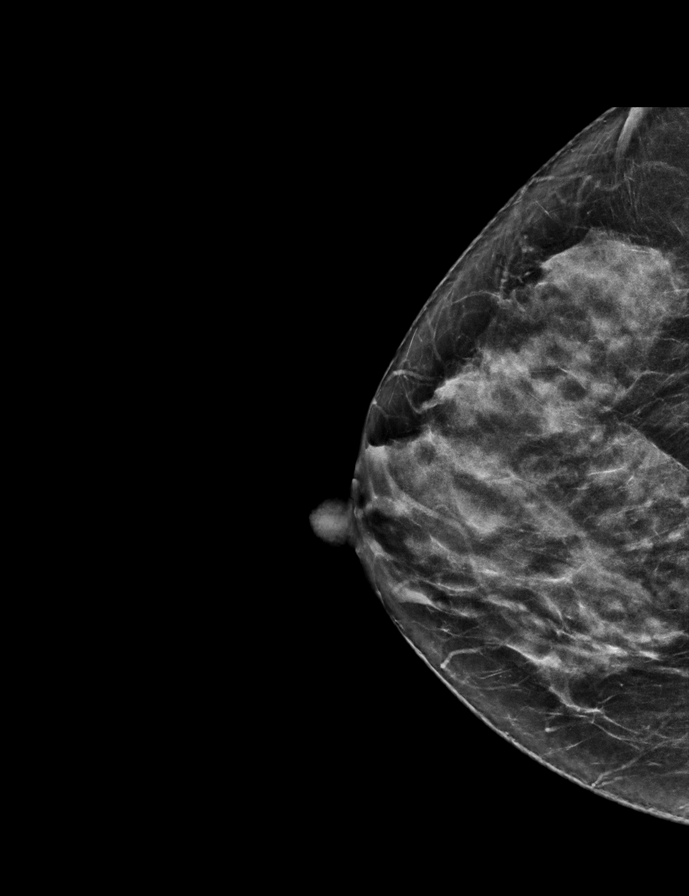

[L CC synth-2D]
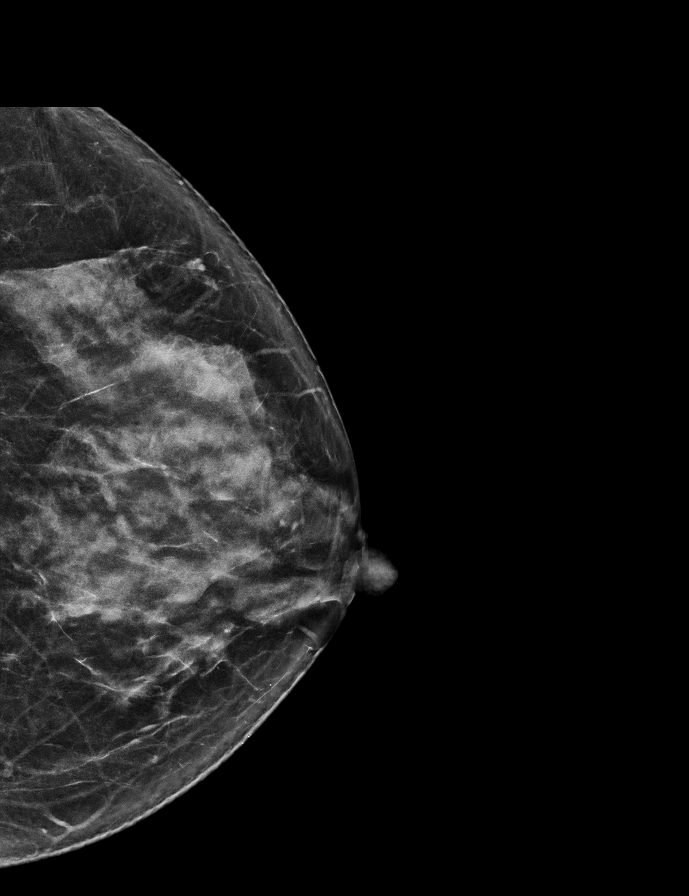

[L MLO synth-2D]
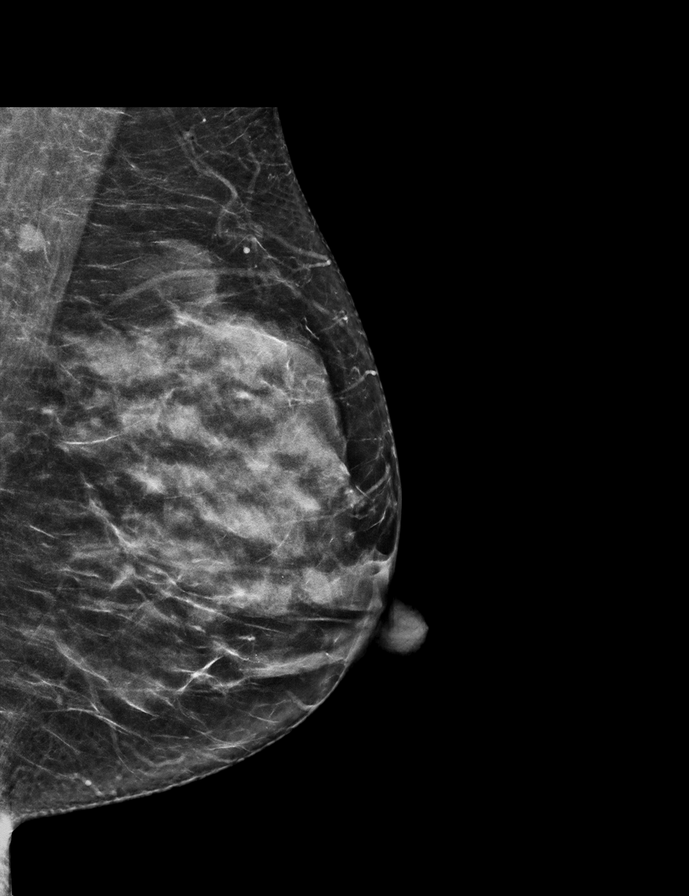

[R CC tomo · 2 of 59 frames shown]
[frame 20/59]
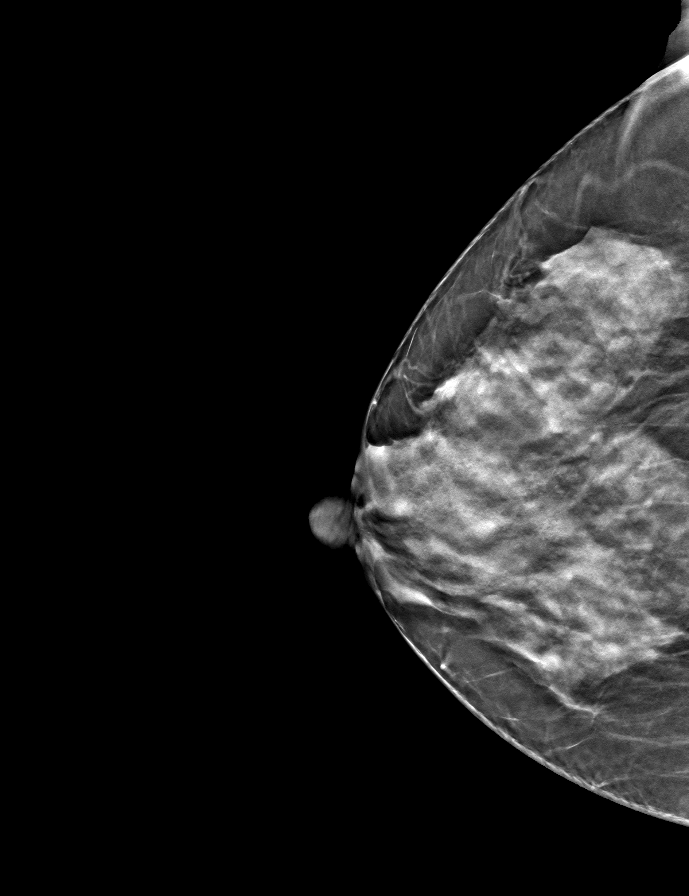
[frame 30/59]
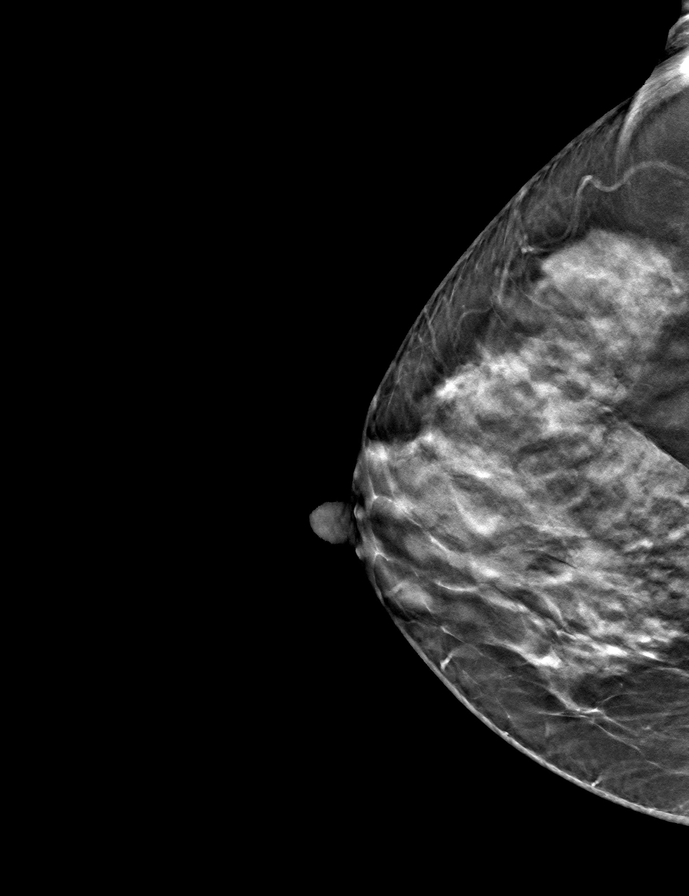

[L MLO tomo · tomo slice 33/64.0]
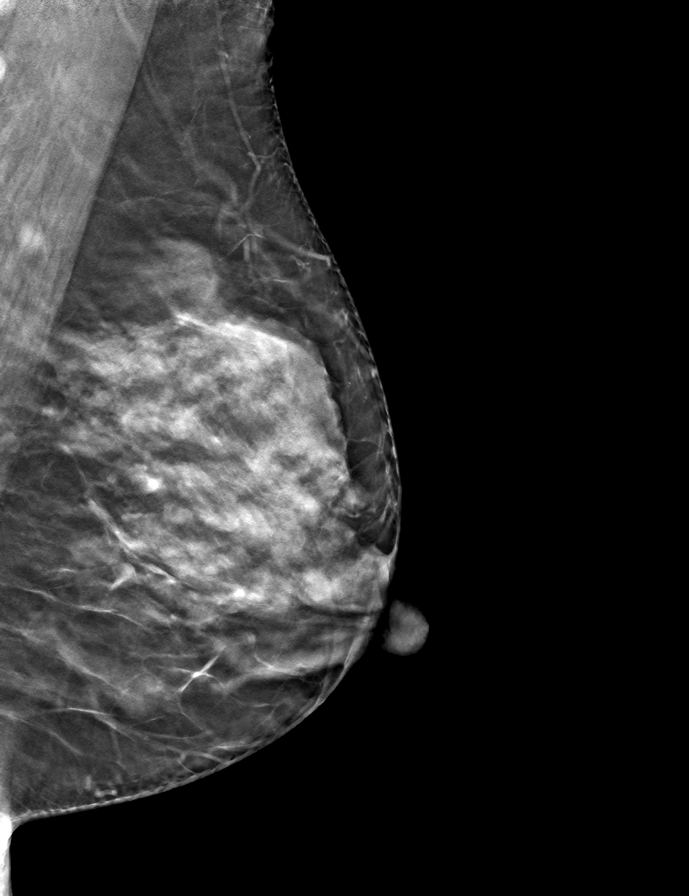

[L CC tomo · tomo slice 31/62.0]
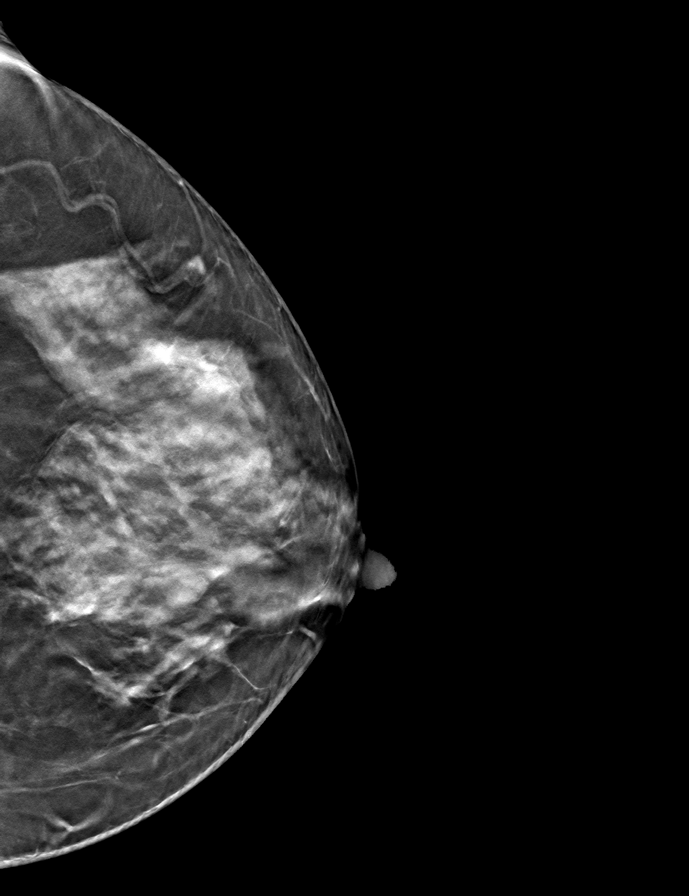

[R MLO tomo · tomo slice 34/67.0]
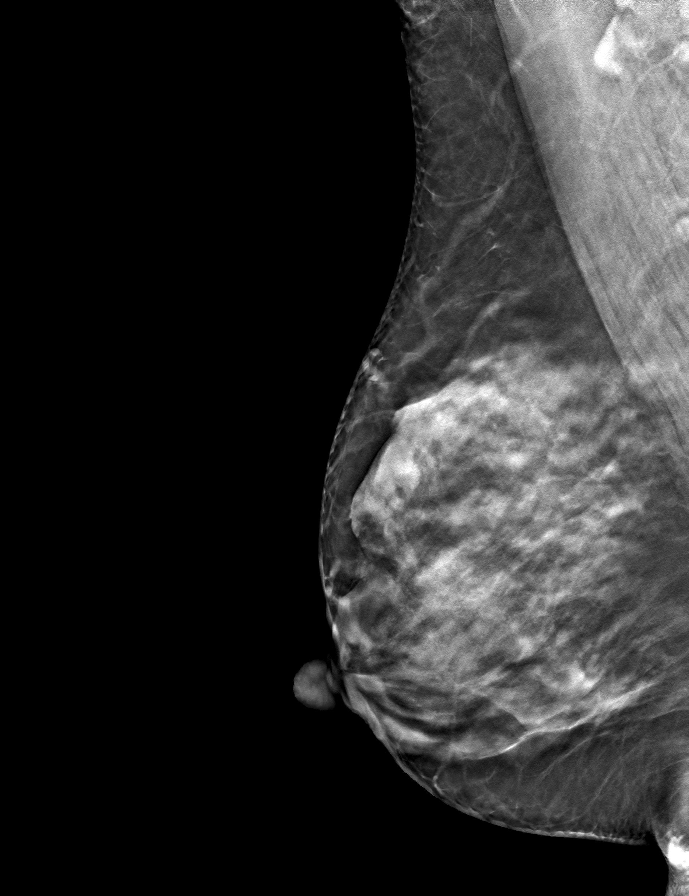

[9 of 24 positions shown; findings below may reference images not displayed]

ACR Breast Density Category c: The breast tissue is heterogeneously
dense, which may obscure small masses.
FINDINGS: There are no findings suspicious for malignancy. Images were
processed with CAD.
IMPRESSION: No mammographic evidence of malignancy. A result letter of this
screening mammogram will be mailed directly to the patient.

RECOMMENDATION:
Screening mammogram in one year. (Code:[id number])

BI-RADS CATEGORY  1: Negative.

## 2021-07-31 ENCOUNTER — Telehealth: Payer: Self-pay

## 2021-07-31 NOTE — Telephone Encounter (Signed)
Left message for patient via [place] [phone] about lab results from [facility]. Left name and number for patient to call back.

## 2021-07-31 NOTE — Telephone Encounter (Signed)
Health coaching 2   interpreter- [place] [phone]   Labs- 186 cholesterol, 103 LDL cholesterol, 144 triglycerides, 58 HDL cholesterol, 5.9 hemoglobin A1C, 117 mean plasma glucose. Patient understands and is aware of her lab results.   Goals-  1. Reduce the amounts of sweets and sugars consumed in both food and drinks. 2. Reduce the amount of carbs consumed. 3. Continue with daily exercise routine.    Navigation:  Patient is aware of 1 more health coaching sessions and a follow up. Will call patient back with follow-up appointment for Internal Medicine once appointment has been scheduled.   Will check with the State to see if I can refer her to [facility] [place] again this year for diabetes prevention.   Time-  15 minutes

## 2021-08-12 ENCOUNTER — Other Ambulatory Visit: Payer: Self-pay

## 2021-08-12 ENCOUNTER — Ambulatory Visit (INDEPENDENT_AMBULATORY_CARE_PROVIDER_SITE_OTHER): Payer: Self-pay | Admitting: Student

## 2021-08-12 ENCOUNTER — Encounter: Payer: Self-pay | Admitting: Student

## 2021-08-12 DIAGNOSIS — G629 Polyneuropathy, unspecified: Secondary | ICD-10-CM | POA: Insufficient documentation

## 2021-08-12 DIAGNOSIS — R7303 Prediabetes: Secondary | ICD-10-CM

## 2021-08-12 NOTE — Assessment & Plan Note (Signed)
Patient complains of bilateral numbness and tingling in both hands, palmar and dorsal aspect.  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   Sensory exam was done today, and patient had full sensation.  Phalen's test also negative.    Plan:  1.  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  Told patient to make an appointment with the clinic if numbness and tingling gets worse

## 2021-08-12 NOTE — Progress Notes (Signed)
CC: Well Woman/Pre-Diabetes Follow Up   HPI:  [name] is a 51 y.o. female living with a history stated below and presents today for a well woman visit/pre-diabetes FU. Please see problem based assessment and plan for additional details.  Patient was seen with interpreter Crystal [name], from [facility].  Past Medical History:  Diagnosis Date   CIN I (cervical intraepithelial neoplasia I)    Pap 02/2019: ASCUS w/ high risk HPV   Depression    Elevated LDL cholesterol level    Obesity (BMI 30-39.9)    Post-menopausal bleeding    Pre-diabetes     Current Outpatient Medications on File Prior to Visit  Medication Sig Dispense Refill   Azelastine HCl 137 MCG/SPRAY SOLN Place into both nostrils 2 (two) times daily.     cefpodoxime (VANTIN) 200 MG tablet Take 1 tablet (200 mg total) by mouth 2 (two) times daily. (Patient not taking: Reported on 07/22/2021) 20 tablet 0   fluticasone (FLONASE) 50 MCG/ACT nasal spray Place into the nose.     metroNIDAZOLE (FLAGYL) 500 MG tablet Take 1 tablet (500 mg total) by mouth 2 (two) times daily. (Patient not taking: Reported on 07/22/2021) 20 tablet 0   No current facility-administered medications on file prior to visit.    Family History  Problem Relation Age of Onset   Hypertension Mother    Diabetes Mother    Heart disease Father    Diabetes Father    Diabetes Sister    Diabetes Sister    Cancer Maternal Aunt        uterine   Diabetes Brother    Anesthesia problems Neg Hx    Breast cancer Neg Hx     Social History   Socioeconomic History   Marital status: Married    Spouse name: Not on file   Number of children: 5   Years of education: Not on file   Highest education level: 6th grade  Occupational History   Not on file  Tobacco Use   Smoking status: Never   Smokeless tobacco: Never  Vaping Use   Vaping Use: Never used  Substance and Sexual Activity   Alcohol use: No   Drug use: No   Sexual activity: Yes    Birth  control/protection: Surgical, Post-menopausal  Other Topics Concern   Not on file  Social History Narrative   ** Merged History Encounter **       Social Determinants of Health   Financial Resource Strain: Not on file  Food Insecurity: No Food Insecurity (07/22/2021)   Hunger Vital Sign    Worried About Running Out of Food in the Last Year: Never true    Ran Out of Food in the Last Year: Never true  Transportation Needs: No Transportation Needs (07/22/2021)   PRAPARE - Hydrologist (Medical): No    Lack of Transportation (Non-Medical): No  Physical Activity: Not on file  Stress: Not on file  Social Connections: Not on file  Intimate Partner Violence: Not on file    Review of Systems: ROS negative except for what is noted on the assessment and plan.  Vitals:   08/12/21 1506  BP: 118/81  Pulse: 66  Temp: 98.2 F (36.8 C)  TempSrc: Oral  SpO2: 98%  Weight: 195 lb 14.4 oz (88.9 kg)  Height: '5\' 3"'$  (1.6 m)    Physical Exam: Constitutional: well-appearing woman sitting comfortably, in no acute distress HENT: normocephalic atraumatic, mucous membranes moist Eyes: conjunctiva  non-erythematous Cardiovascular: regular rate and rhythm, no m/r/g Pulmonary/Chest: normal work of breathing on room air, lungs clear to auscultation bilaterally MSK: normal bulk and tone Neurological: alert & oriented x 3, 5/5 strength in bilateral upper and lower extremities, normal gait, normal sensation in all extremities Skin: warm and dry  Assessment & Plan:   Pre-diabetes Patient's last A1c was done on July 22, 2021, and showed a value of 5.9, which puts her in the prediabetic range.  She says that she has been very active, going to the gym for about an hour and a half every day, as well as eating a lot of vegetables and cleaning up her diet.  Patient denies any polyuria, vision changes, polydipsia. No blisters or ulcers on feet, distal pulses were palpated  +2.  Plan: 1.  At this time, due to her A1c value, exercise activity, and improving diet, I do not see the need to add any sort of diabetic medication. 2.  We will follow-up in a year regarding this, and at that point if A1c is increased, we will consider adding metformin.  Peripheral neuropathy Patient complains of bilateral numbness and tingling in both hands, palmar and dorsal aspect.  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   Sensory exam was done today, and patient had full sensation.  Phalen's test also negative.    Plan:  1.  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  Told patient to make an appointment with the clinic if numbness and tingling gets worse  Patient seen with Dr. Kelle Darting Kailo [name], M.D. [facility], PGY-1 Phone: [phone] Date 08/12/2021 Time 6:04 PM

## 2021-08-12 NOTE — Assessment & Plan Note (Addendum)
Patient's last A1c was done on July 22, 2021, and showed a value of 5.9, which puts her in the prediabetic range.  She says that she has been very active, going to the gym for about an hour and a half every day, as well as eating a lot of vegetables and cleaning up her diet.  Patient denies any polyuria, vision changes, polydipsia. No blisters or ulcers on feet, distal pulses were palpated +2.  Plan: 1.  At this time, due to her A1c value, exercise activity, and improving diet, I do not see the need to add any sort of diabetic medication. 2.  We will follow-up in a year regarding this, and at that point if A1c is increased, we will consider adding metformin.

## 2021-08-12 NOTE — Patient Instructions (Addendum)
Thank you so much for coming into the clinic today! We talked about a few things, here is a quick summary:   Your sugar level is doing good! Please continue exercising and dieting and we don't need to add any medication! Regarding your hands, please incorporate breaks in your schedule at work, just to give your hands a rest before you start again! If your hands start getting worse, please come in and see [place].    If you have any questions or concerns, please don't hesitate to call the clinic at [place], Dr. Jayme Cloud gracias por venir a la clnica hoy! Hablamos de algunas cosas, aqu hay un resumen rpido:   1. Tu nivel de azcar est bien! Contine haciendo ejercicio y haciendo dieta y no necesitamos agregar ningn medicamento! 2. Con respecto a sus manos, incorpore descansos en su horario de [place], solo para que sus manos descansen antes de comenzar de [place]! 3. Si sus manos comienzan a empeorar, por favor venga a vernos.   Si tiene alguna pregunta o inquietud, no dude en llamar a la clnica al [place], Dr. Sanjuana Mae

## 2021-08-16 NOTE — Progress Notes (Signed)
Internal Medicine Clinic Attending  I saw and evaluated the patient.  I personally confirmed the key portions of the history and exam documented by Dr. Nooruddin and I reviewed pertinent patient test results.  The assessment, diagnosis, and plan were formulated together and I agree with the documentation in the resident's note.  

## 2021-10-21 ENCOUNTER — Telehealth: Payer: Self-pay

## 2021-10-21 NOTE — Telephone Encounter (Signed)
Health Coaching 3  interpreter- Rudene Anda, [employer]   Goals- Patient has been going to the gym 4 days a week. Patient does a combination of walking on the treadmill and weight lifting. Patient has started preparing her meals from home instead of eating out. Patient has also been packing her lunch for work instead of eating out. Patient has added salmon into her diet.    New goal- Encouraged patient to focus on maintaining all the changes that she has made in regards to diet and exercise.  Barrier to reaching goal-    Strategies to overcome-    Navigation:  Patient is aware of  a follow up session on 12/04/21 @ 2:00 pm.   Time-  10 minutes

## 2021-12-04 ENCOUNTER — Inpatient Hospital Stay: Payer: No Typology Code available for payment source | Attending: Obstetrics and Gynecology | Admitting: *Deleted

## 2021-12-04 VITALS — BP 122/84 | Ht 64.0 in | Wt 194.2 lb

## 2021-12-04 DIAGNOSIS — Z0000 Encounter for general adult medical examination without abnormal findings: Secondary | ICD-10-CM

## 2021-12-04 NOTE — Progress Notes (Signed)
[facility] follow up   Interpreter: Rudene Anda, [employer]  Clinical Measurement:  There were no vitals filed for this visit.    Medical History:  Patient states that she does not have high cholesterol, does not have high blood pressure and she does not have diabetes.  Medications:  Patient states that she does not take medication to lower cholesterol, blood pressure and blood sugar.  Patient does not take an aspirin a day to help prevent a heart attack or stroke.    Blood pressure, self measurement: Patient states that she does not measure blood pressure from home. She checks her blood pressure N/A. She shares her readings with a health care provider: N/A.   Nutrition: Patient states that on average she eats 3 cups of fruit and 1 cups of vegetables per day. Patient states that she does eat fish at least 2 times per week. Patient eats less than half servings of whole grains. Patient drinks less than 36 ounces of beverages with added sugar weekly: yes. Patient is currently watching sodium or salt intake: yes. In the past 7 days patient has had 0 drinks containing alcohol. On average patient drinks 0 drinks containing alcohol per day.      Physical activity:  Patient states that she gets 300 minutes of moderate and 0 minutes of vigorous physical activity each week.  Smoking status:  Patient states that she has has never smoked .   Quality of life:  Over the past 2 weeks patient states that she had little interest or pleasure in doing things: not at all. She has been feeling down, depressed or hopeless:not at all.    Risk reduction and counseling: Spoke with patient about continuing to practice a heart healthy diet by watching the amount of fried and fatty foods consumed, watching the amount of of red meats consumed, increase fresh fruit and vegetables intake as well as whole grains. Maintain daily exercise for at least 20-30 minutes.     Navigation: This was the  follow up session for this  patient, I will check up on her progress in the coming months.  Time: 20 minutes

## 2022-01-30 ENCOUNTER — Other Ambulatory Visit: Payer: Self-pay

## 2022-01-30 ENCOUNTER — Emergency Department (HOSPITAL_COMMUNITY)
Admission: EM | Admit: 2022-01-30 | Discharge: 2022-01-31 | Disposition: A | Discharge status: Home / Self Care | Payer: Managed Care, Other (non HMO) | Attending: Emergency Medicine | Admitting: Emergency Medicine

## 2022-01-30 DIAGNOSIS — R300 Dysuria: Secondary | ICD-10-CM | POA: Diagnosis present

## 2022-01-30 DIAGNOSIS — N3001 Acute cystitis with hematuria: Secondary | ICD-10-CM | POA: Insufficient documentation

## 2022-01-30 NOTE — ED Provider Triage Note (Signed)
Emergency Medicine Provider Triage Evaluation Note  Crystal [name] , a 52 y.o. female  was evaluated in triage.  Pt complains of dysuria that began yesterday.  Does have hx of UTI with similar symptoms. No fever, back pain, or vomiting.  Review of Systems  Positive: dysuria Negative: fever  Physical Exam  BP 122/79 (BP Location: Right Arm)   Pulse 72   Temp 97.9 F (36.6 C) (Oral)   Resp 18   LMP 12/10/2019 (Exact Date)   SpO2 100%  Gen:   Awake, no distress   Resp:  Normal effort  MSK:   Moves extremities without difficulty  Other:    Medical Decision Making  Medically screening exam initiated at 11:25 PM.  Appropriate orders placed.  Crystal [name] was informed that the remainder of the evaluation will be completed by another provider, this initial triage assessment does not replace that evaluation, and the importance of remaining in the ED until their evaluation is complete.  Dysuria.  UA w/culture ordered.   Larene Pickett, PA-C 02/04/22 2203

## 2022-01-30 NOTE — ED Triage Notes (Signed)
Patient reports dysuria onset yesterday , denies fever or emesis , history of UTI .

## 2022-01-31 LAB — URINALYSIS, ROUTINE W REFLEX MICROSCOPIC
Bilirubin Urine: NEGATIVE
Glucose, UA: NEGATIVE mg/dL
Ketones, ur: NEGATIVE mg/dL
Nitrite: NEGATIVE
Protein, ur: NEGATIVE mg/dL
Specific Gravity, Urine: 1.002 — ABNORMAL LOW (ref 1.005–1.030)
pH: 6 (ref 5.0–8.0)

## 2022-01-31 MED ORDER — CEPHALEXIN 500 MG PO CAPS: 500.0000 mg | ORAL_CAPSULE | Freq: Four times a day (QID) | ORAL | 0 refills | Status: AC

## 2022-01-31 NOTE — ED Provider Notes (Signed)
[facility] EMERGENCY DEPARTMENT Provider Note   CSN: [id number] Arrival date & time: 01/30/22  2309     History  Chief Complaint  Patient presents with   Dysuria    Crystal [name] is a 52 y.o. female. With past medical history of hypercholesterolemia who presents to the emergency department with dysuria.  States she has had 2 days of burning with urination.  She states that she has also had some mild suprapubic tenderness.  She endorses having increased frequency.  Denies having flank pain, generalized abdominal pain, fevers, nausea or vomiting, hematuria, decreased urine output, vaginal discharge.  Denies history of kidney disease.   Dysuria      Home Medications Prior to Admission medications   Medication Sig Start Date End Date Taking? Authorizing Provider  cephALEXin (KEFLEX) 500 MG capsule Take 1 capsule (500 mg total) by mouth 4 (four) times daily for 5 days. 01/31/22 02/05/22 Yes Mickie Hillier, PA-C  Azelastine HCl 137 MCG/SPRAY SOLN Place into both nostrils 2 (two) times daily. 07/12/21   [provider]  cefpodoxime (VANTIN) 200 MG tablet Take 1 tablet (200 mg total) by mouth 2 (two) times daily. Patient not taking: Reported on 07/22/2021 07/12/20   Petrucelli, Aldona Bar R, PA-C  fluticasone (FLONASE) 50 MCG/ACT nasal spray Place into the nose. 07/12/21 07/12/22  [provider]  metroNIDAZOLE (FLAGYL) 500 MG tablet Take 1 tablet (500 mg total) by mouth 2 (two) times daily. Patient not taking: Reported on 07/22/2021 09/26/20   Charlann Lange, PA-C      Allergies    Patient has no known allergies.    Review of Systems   Review of Systems  Genitourinary:  Positive for dysuria and frequency.  All other systems reviewed and are negative.   Physical Exam Updated Vital Signs BP 120/72 (BP Location: Right Arm)   Pulse 64   Temp (!) 97 F (36.1 C)   Resp 18   LMP 12/10/2019 (Exact Date)   SpO2 100%  Physical Exam Vitals and  nursing note reviewed.  Constitutional:      General: She is not in acute distress.    Appearance: Normal appearance. She is not ill-appearing or toxic-appearing.  HENT:     Head: Normocephalic and atraumatic.  Eyes:     General: No scleral icterus. Cardiovascular:     Rate and Rhythm: Normal rate and regular rhythm.     Pulses: Normal pulses.     Heart sounds: No murmur heard. Pulmonary:     Effort: Pulmonary effort is normal. No respiratory distress.     Breath sounds: Normal breath sounds.  Abdominal:     General: Abdomen is protuberant. Bowel sounds are normal.     Palpations: Abdomen is soft.     Tenderness: There is abdominal tenderness in the suprapubic area. There is no right CVA tenderness, left CVA tenderness or guarding.  Skin:    General: Skin is warm and dry.     Capillary Refill: Capillary refill takes less than 2 seconds.     Findings: No rash.  Neurological:     General: No focal deficit present.     Mental Status: She is alert and oriented to person, place, and time. Mental status is at baseline.  Psychiatric:        Mood and Affect: Mood normal.        Behavior: Behavior normal.        Thought Content: Thought content normal.  Judgment: Judgment normal.     ED Results / Procedures / Treatments   Labs (all labs ordered are listed, but only abnormal results are displayed) Labs Reviewed  URINALYSIS, ROUTINE W REFLEX MICROSCOPIC - Abnormal; Notable for the following components:      Result Value   Color, Urine STRAW (*)    APPearance HAZY (*)    Specific Gravity, Urine 1.002 (*)    Hgb urine dipstick MODERATE (*)    Leukocytes,Ua LARGE (*)    Bacteria, UA FEW (*)    All other components within normal limits  URINE CULTURE    EKG None  Radiology No results found.  Procedures Procedures   Medications Ordered in ED Medications - No data to display  ED Course/ Medical Decision Making/ A&P                           Medical Decision  Making Initial Impression and Ddx 52 year old female who presents to the emergency department with dysuria and suprapubic abdominal tenderness.  This has been ongoing for 2 days.  She is well-appearing, nonseptic and nontoxic in appearance.  Her abdomen is soft and not peritonitic.  There is no CVA tenderness on my exam. Patient PMH that increases complexity of ED encounter: Hyperlipidemia Differential includes UTI versus Pyelo, stone   Interpretation of Diagnostics I independent reviewed and interpreted the labs as followed: UA consistent with UTI.  Urine culture sent.  - I independently visualized the following imaging with scope of interpretation limited to determining acute life threatening conditions related to emergency care: Not indicated  Patient Reassessment and Ultimate Disposition/Management Her UA is consistent with UTI.  A urine culture was sent. She has no CVA tenderness so doubt pyelonephritis.  Also doubt renal stone, obstructed stone, infected stone. She has no vaginal discharge concerning for STD, TOA, PID as a source of her symptoms. I have low suspicion for AKI and do not feel that she needs lab work at this time.  She has no history of renal disease. Will discharge on Keflex. The patient has been appropriately medically screened and/or stabilized in the ED. I have low suspicion for any other emergent medical condition which would require further screening, evaluation or treatment in the ED or require inpatient management. At time of discharge the patient is hemodynamically stable and in no acute distress. I have discussed work-up results and diagnosis with patient and answered all questions. Patient is agreeable with discharge plan. We discussed strict return precautions for returning to the emergency department and they verbalized understanding.    Professional interpreter used for history and exam. Patient management required discussion with the following services or  consulting groups:  None  Complexity of Problems Addressed Acute complicated illness or Injury  Additional Data Reviewed and Analyzed Further history obtained from: Prior ED visit notes and Care Everywhere  Patient Encounter Risk Assessment Prescriptions  Final Clinical Impression(s) / ED Diagnoses Final diagnoses:  Acute cystitis with hematuria    Rx / DC Orders ED Discharge Orders          Ordered    cephALEXin (KEFLEX) 500 MG capsule  4 times daily        01/31/22 0833              Mickie Hillier, PA-C 01/31/22 2951    Cristie Hem, MD 01/31/22 1240

## 2022-01-31 NOTE — Discharge Instructions (Signed)
You have a urinary tract infection.  I have placed you on antibiotic called Keflex that you will take 4 times a day over the next 5 days.  Please return to the emergency department if you begin to have abdominal or back pain or fevers.  Additionally please return to the emergency department for chest pain, shortness of breath or difficulty breathing, strokelike symptoms.

## 2022-02-01 LAB — URINE CULTURE: Culture: <10000 — AB

## 2022-02-10 ENCOUNTER — Ambulatory Visit
Admission: EM | Admit: 2022-02-10 | Discharge: 2022-02-10 | Disposition: A | Discharge status: Home / Self Care | Payer: Managed Care, Other (non HMO) | Attending: Physician Assistant | Admitting: Physician Assistant

## 2022-02-10 DIAGNOSIS — N3000 Acute cystitis without hematuria: Secondary | ICD-10-CM | POA: Insufficient documentation

## 2022-02-10 LAB — POCT URINALYSIS DIP (MANUAL ENTRY)
Bilirubin, UA: NEGATIVE
Glucose, UA: NEGATIVE mg/dL
Ketones, POC UA: NEGATIVE mg/dL
Nitrite, UA: NEGATIVE
Protein Ur, POC: NEGATIVE mg/dL
Spec Grav, UA: 1.01 (ref 1.010–1.025)
Urobilinogen, UA: 0.2 E.U./dL
pH, UA: 5.5 (ref 5.0–8.0)

## 2022-02-10 MED ORDER — NITROFURANTOIN MONOHYD MACRO 100 MG PO CAPS: 100.0000 mg | ORAL_CAPSULE | Freq: Two times a day (BID) | ORAL | 0 refills | Status: DC

## 2022-02-10 NOTE — ED Triage Notes (Signed)
Pt c/o dysuria, frequency, pelvic pain,   Onset ~ yesterday   States pt had this experience ~ 2 weeks ago and given abx. Pt has completed tx. States sxs did resolve but has returned. (Cephalexin)

## 2022-02-10 NOTE — ED Provider Notes (Signed)
[name]    CSN: [id number] Arrival date & time: 02/10/22  0950      History   Chief Complaint Chief Complaint  Patient presents with   Dysuria    HPI Crystal [name] is a 52 y.o. female.   Patient here today for valuation of suspected recurrent UTI.  She states she was treated for same about 2 weeks ago and did have improvement with antibiotic provided however symptoms have returned.  She complains of dysuria, suprapubic pain, and urinary frequency.  She has not had any fever.  She denies any nausea or vomiting.  The history is provided by the patient. The history is limited by a language barrier. A language interpreter was used [employer] [phone]).  Dysuria Associated symptoms: abdominal pain   Associated symptoms: no fever, no nausea and no vomiting     Past Medical History:  Diagnosis Date   CIN I (cervical intraepithelial neoplasia I)    Pap 02/2019: ASCUS w/ high risk HPV   Depression    Elevated LDL cholesterol level    Obesity (BMI 30-39.9)    Post-menopausal bleeding    Pre-diabetes     Patient Active Problem List   Diagnosis Date Noted   Peripheral neuropathy 08/12/2021   Acute cystitis without hematuria 05/07/2020   Pre-diabetes 05/07/2020   Elevated LDL cholesterol level 05/07/2020   Obesity (BMI 30-39.9) 05/07/2020   Depression 05/07/2020   Healthcare maintenance 05/07/2020   ASCUS with positive high risk HPV cervical 03/17/2020   Postmenopausal bleeding 03/17/2020    Past Surgical History:  Procedure Laterality Date   CHOLECYSTECTOMY N/A 07/28/2012   Procedure: LAPAROSCOPIC CHOLECYSTECTOMY WITH INTRAOPERATIVE CHOLANGIOGRAM;  Surgeon: Imogene Burn. Georgette Dover, MD;  Location: [facility];  Service: General;  Laterality: N/A;   TUBAL LIGATION     7 years ago    OB History     Gravida  6   Para  4   Term  4   Preterm      AB  1   Living  5      SAB      IAB      Ectopic      Multiple  1   Live Births  5             Home Medications    Prior to Admission medications   Medication Sig Start Date End Date Taking? Authorizing Provider  nitrofurantoin, macrocrystal-monohydrate, (MACROBID) 100 MG capsule Take 1 capsule (100 mg total) by mouth 2 (two) times daily. 02/10/22  Yes Francene Finders, PA-C  fluticasone [facility]) 50 MCG/ACT nasal spray Place into the nose. 07/12/21 07/12/22  [provider]    Family History Family History  Problem Relation Age of Onset   Hypertension Mother    Diabetes Mother    Heart disease Father    Diabetes Father    Diabetes Sister    Diabetes Sister    Cancer Maternal Aunt        uterine   Diabetes Brother    Anesthesia problems Neg Hx    Breast cancer Neg Hx     Social History Social History   Tobacco Use   Smoking status: Never   Smokeless tobacco: Never  Vaping Use   Vaping Use: Never used  Substance Use Topics   Alcohol use: No   Drug use: No     Allergies   Patient has no known allergies.   Review of Systems Review of Systems  Constitutional:  Negative for chills and fever.  Respiratory:  Negative for shortness of breath.   Gastrointestinal:  Positive for abdominal pain. Negative for nausea and vomiting.  Genitourinary:  Positive for dysuria and frequency.  Musculoskeletal:  Negative for back pain.     Physical Exam Triage Vital Signs ED Triage Vitals  Enc Vitals Group     BP 02/10/22 1051 126/86     Pulse Rate 02/10/22 1051 70     Resp 02/10/22 1051 16     Temp 02/10/22 1051 98.3 F (36.8 C)     Temp Source 02/10/22 1051 Oral     SpO2 02/10/22 1051 98 %     Weight --      Height --      Head Circumference --      Peak Flow --      Pain Score 02/10/22 1049 0     Pain Loc --      Pain Edu? --      Excl. in [place]? --    No data found.  Updated Vital Signs BP 126/86 (BP Location: Right Arm)   Pulse 70   Temp 98.3 F (36.8 C) (Oral)   Resp 16   LMP 12/10/2019 (Exact Date)   SpO2 98%     Physical  Exam Vitals and nursing note reviewed.  Constitutional:      General: She is not in acute distress.    Appearance: Normal appearance. She is not ill-appearing.  HENT:     Head: Normocephalic and atraumatic.     Nose: Nose normal.  Cardiovascular:     Rate and Rhythm: Normal rate and regular rhythm.     Heart sounds: Normal heart sounds. No murmur heard. Pulmonary:     Effort: Pulmonary effort is normal. No respiratory distress.     Breath sounds: Normal breath sounds. No wheezing, rhonchi or rales.  Skin:    General: Skin is warm and dry.  Neurological:     Mental Status: She is alert.  Psychiatric:        Mood and Affect: Mood normal.        Thought Content: Thought content normal.      UC Treatments / Results  Labs (all labs ordered are listed, but only abnormal results are displayed) Labs Reviewed  POCT URINALYSIS DIP (MANUAL ENTRY) - Abnormal; Notable for the following components:      Result Value   Clarity, UA cloudy (*)    Blood, UA large (*)    Leukocytes, UA Large (3+) (*)    All other components within normal limits  URINE CULTURE    EKG   Radiology No results found.  Procedures Procedures (including critical care time)  Medications Ordered in UC Medications - No data to display  Initial Impression / Assessment and Plan / UC Course  I have reviewed the triage vital signs and the nursing notes.  Pertinent labs & imaging results that were available during my care of the patient were reviewed by me and considered in my medical decision making (see chart for details).    Macrobid prescribed for suspected UTI.  Urine culture ordered.  Recommended further evaluation if no gradual improvement or with any further concerns.  Final Clinical Impressions(s) / UC Diagnoses   Final diagnoses:  Acute cystitis without hematuria   Discharge Instructions   None    ED Prescriptions     Medication Sig Dispense Auth. Provider   nitrofurantoin,  macrocrystal-monohydrate, (MACROBID) 100 MG capsule Take 1  capsule (100 mg total) by mouth 2 (two) times daily. 10 capsule Francene Finders, PA-C      PDMP not reviewed this encounter.   Francene Finders, PA-C 02/10/22 1216

## 2022-02-11 LAB — URINE CULTURE: Culture: <10000 — AB

## 2022-03-24 ENCOUNTER — Ambulatory Visit: Payer: Managed Care, Other (non HMO)

## 2022-03-24 ENCOUNTER — Ambulatory Visit
Admission: EM | Admit: 2022-03-24 | Discharge: 2022-03-24 | Disposition: A | Discharge status: Home / Self Care | Payer: Managed Care, Other (non HMO) | Attending: Physician Assistant | Admitting: Physician Assistant

## 2022-03-24 DIAGNOSIS — R059 Cough, unspecified: Secondary | ICD-10-CM

## 2022-03-24 DIAGNOSIS — J209 Acute bronchitis, unspecified: Secondary | ICD-10-CM | POA: Diagnosis not present

## 2022-03-24 MED ORDER — AMOXICILLIN-POT CLAVULANATE 875-125 MG PO TABS: 1.0000 | ORAL_TABLET | Freq: Two times a day (BID) | ORAL | 0 refills | Status: DC

## 2022-03-24 NOTE — Discharge Instructions (Addendum)
See your Physician for recheck in 1 week  °

## 2022-03-24 NOTE — ED Triage Notes (Signed)
Pt presents with productive cough with clear mucous; pt also complains of discomfort while urinating since yesterday.

## 2022-03-29 NOTE — ED Provider Notes (Signed)
[name]    CSN: [id number] Arrival date & time: 03/24/22  1555      History   Chief Complaint Chief Complaint  Patient presents with   Cough   UTI    HPI Crystal [name] is a 52 y.o. female.   Patient complains of a cough and congestion patient complains of sinus drainage patient reports that she has a history of bronchitis and sinus infections.  Patient reports she has had symptoms over a week.  Patient complains of burning with urination that started yesterday  The history is provided by the patient.  Cough   Past Medical History:  Diagnosis Date   CIN I (cervical intraepithelial neoplasia I)    Pap 02/2019: ASCUS w/ high risk HPV   Depression    Elevated LDL cholesterol level    Obesity (BMI 30-39.9)    Post-menopausal bleeding    Pre-diabetes     Patient Active Problem List   Diagnosis Date Noted   Peripheral neuropathy 08/12/2021   Acute cystitis without hematuria 05/07/2020   Pre-diabetes 05/07/2020   Elevated LDL cholesterol level 05/07/2020   Obesity (BMI 30-39.9) 05/07/2020   Depression 05/07/2020   Healthcare maintenance 05/07/2020   ASCUS with positive high risk HPV cervical 03/17/2020   Postmenopausal bleeding 03/17/2020    Past Surgical History:  Procedure Laterality Date   CHOLECYSTECTOMY N/A 07/28/2012   Procedure: LAPAROSCOPIC CHOLECYSTECTOMY WITH INTRAOPERATIVE CHOLANGIOGRAM;  Surgeon: Imogene Burn. Georgette Dover, MD;  Location: [place];  Service: General;  Laterality: N/A;   TUBAL LIGATION     7 years ago    OB History     Gravida  6   Para  4   Term  4   Preterm      AB  1   Living  5      SAB      IAB      Ectopic      Multiple  1   Live Births  5            Home Medications    Prior to Admission medications   Medication Sig Start Date End Date Taking? Authorizing Provider  amoxicillin-clavulanate (AUGMENTIN) 875-125 MG tablet Take 1 tablet by mouth 2 (two) times daily. 03/24/22  Yes Caryl Ada  K, PA-C  fluticasone (FLONASE) 50 MCG/ACT nasal spray Place into the nose. 07/12/21 07/12/22  [provider]  nitrofurantoin, macrocrystal-monohydrate, (MACROBID) 100 MG capsule Take 1 capsule (100 mg total) by mouth 2 (two) times daily. 02/10/22   Francene Finders, PA-C    Family History Family History  Problem Relation Age of Onset   Hypertension Mother    Diabetes Mother    Heart disease Father    Diabetes Father    Diabetes Sister    Diabetes Sister    Cancer Maternal Aunt        uterine   Diabetes Brother    Anesthesia problems Neg Hx    Breast cancer Neg Hx     Social History Social History   Tobacco Use   Smoking status: Never   Smokeless tobacco: Never  Vaping Use   Vaping Use: Never used  Substance Use Topics   Alcohol use: No   Drug use: No     Allergies   Patient has no known allergies.   Review of Systems Review of Systems  Respiratory:  Positive for cough.   All other systems reviewed and are negative.    Physical Exam Triage Vital  Signs ED Triage Vitals  Enc Vitals Group     BP 03/24/22 1829 (!) 129/90     Pulse Rate 03/24/22 1829 82     Resp 03/24/22 1829 17     Temp 03/24/22 1829 98.2 F (36.8 C)     Temp Source 03/24/22 1829 Oral     SpO2 03/24/22 1829 98 %     Weight --      Height --      Head Circumference --      Peak Flow --      Pain Score 03/24/22 1835 5     Pain Loc --      Pain Edu? --      Excl. in [employer]? --    No data found.  Updated Vital Signs BP (!) 129/90 (BP Location: Left Arm)   Pulse 82   Temp 98.2 F (36.8 C) (Oral)   Resp 17   LMP 12/10/2019 (Exact Date)   SpO2 98%   Visual Acuity Right Eye Distance:   Left Eye Distance:   Bilateral Distance:    Right Eye Near:   Left Eye Near:    Bilateral Near:     Physical Exam Vitals and nursing note reviewed.  Constitutional:      Appearance: She is well-developed.  HENT:     Head: Normocephalic.     Nose: Nose normal.     Mouth/Throat:      Mouth: Mucous membranes are moist.  Cardiovascular:     Rate and Rhythm: Normal rate.  Pulmonary:     Effort: Pulmonary effort is normal.  Abdominal:     General: There is no distension.  Musculoskeletal:        General: Normal range of motion.     Cervical back: Normal range of motion.  Neurological:     Mental Status: She is alert and oriented to person, place, and time.      UC Treatments / Results  Labs (all labs ordered are listed, but only abnormal results are displayed) Labs Reviewed - No data to display  EKG   Radiology No results found.  Procedures Procedures (including critical care time)  Medications Ordered in UC Medications - No data to display  Initial Impression / Assessment and Plan / UC Course  I have reviewed the triage vital signs and the nursing notes.  Pertinent labs & imaging results that were available during my care of the patient were reviewed by me and considered in my medical decision making (see chart for details).     MDM: Chest x-ray shows bronchitic changes.  Final Clinical Impressions(s) / UC Diagnoses   Final diagnoses:  Acute bronchitis, unspecified organism     Discharge Instructions      See your Physician for recheck in 1 week   ED Prescriptions     Medication Sig Dispense Auth. Provider   amoxicillin-clavulanate (AUGMENTIN) 875-125 MG tablet Take 1 tablet by mouth 2 (two) times daily. 20 tablet Fransico Meadow, [place]      PDMP not reviewed this encounter. An After Visit Summary was printed and given to the patient.       Fransico Meadow, [place] 03/29/22 1438

## 2022-04-18 NOTE — Progress Notes (Unsigned)
  Subjective:    Crystal [name] - 52 y.o. female MRN [id number]  Date of birth: 07/22/1970  HPI  Twisha Granneman is to establish care.   Current issues and/or concerns: [facility] 02/17/2022 - annual physical exam   ROS per HPI     Health Maintenance:  Health Maintenance Due  Topic Date Due   Hepatitis C Screening  Never done   DTaP/Tdap/Td (1 - Tdap) Never done   COLONOSCOPY (Pts 45-37yrs Insurance coverage will need to be confirmed)  Never done   INFLUENZA VACCINE  08/27/2021   COVID-19 Vaccine (4 - 2023-24 season) 09/27/2021     Past Medical History: Patient Active Problem List   Diagnosis Date Noted   Peripheral neuropathy 08/12/2021   Acute cystitis without hematuria 05/07/2020   Pre-diabetes 05/07/2020   Elevated LDL cholesterol level 05/07/2020   Obesity (BMI 30-39.9) 05/07/2020   Depression 05/07/2020   Healthcare maintenance 05/07/2020   ASCUS with positive high risk HPV cervical 03/17/2020   Postmenopausal bleeding 03/17/2020      Social History   reports that she has never smoked. She has never used smokeless tobacco. She reports that she does not drink alcohol and does not use drugs.   Family History  family history includes Cancer in her maternal aunt; Diabetes in her brother, father, mother, sister, and sister; Heart disease in her father; Hypertension in her mother.   Medications: reviewed and updated   Objective:   Physical Exam LMP 12/10/2019 (Exact Date)  Physical Exam      Assessment & Plan:         Patient was given clear instructions to go to Emergency Department or return to medical center if symptoms don't improve, worsen, or new problems develop.The patient verbalized understanding.  I discussed the assessment and treatment plan with the patient. The patient was provided an opportunity to ask questions and all were answered. The patient agreed with the plan and demonstrated an  understanding of the instructions.   The patient was advised to call back or seek an in-person evaluation if the symptoms worsen or if the condition fails to improve as anticipated.    Ricky Stabs, NP 04/18/2022, 8:31 AM Primary Care at [facility]

## 2022-04-21 ENCOUNTER — Encounter: Payer: No Typology Code available for payment source | Admitting: Family

## 2022-04-21 DIAGNOSIS — Z7689 Persons encountering health services in other specified circumstances: Secondary | ICD-10-CM

## 2022-04-21 DIAGNOSIS — Z789 Other specified health status: Secondary | ICD-10-CM

## 2022-05-05 ENCOUNTER — Other Ambulatory Visit: Payer: Self-pay

## 2022-05-05 DIAGNOSIS — Z1231 Encounter for screening mammogram for malignant neoplasm of breast: Secondary | ICD-10-CM

## 2022-06-09 ENCOUNTER — Ambulatory Visit
Admission: EM | Admit: 2022-06-09 | Discharge: 2022-06-09 | Disposition: A | Discharge status: Home / Self Care | Payer: Managed Care, Other (non HMO) | Attending: Family Medicine | Admitting: Family Medicine

## 2022-06-09 DIAGNOSIS — N3090 Cystitis, unspecified without hematuria: Secondary | ICD-10-CM | POA: Insufficient documentation

## 2022-06-09 LAB — POCT URINALYSIS DIP (MANUAL ENTRY)
Bilirubin, UA: NEGATIVE
Glucose, UA: NEGATIVE mg/dL
Ketones, POC UA: NEGATIVE mg/dL
Nitrite, UA: NEGATIVE
Protein Ur, POC: NEGATIVE mg/dL
Spec Grav, UA: 1.015 (ref 1.010–1.025)
Urobilinogen, UA: 0.2 E.U./dL
pH, UA: 7 (ref 5.0–8.0)

## 2022-06-09 MED ORDER — CIPROFLOXACIN HCL 500 MG PO TABS: 500.0000 mg | ORAL_TABLET | Freq: Two times a day (BID) | ORAL | 0 refills | Status: DC

## 2022-06-09 MED ORDER — PHENAZOPYRIDINE HCL 100 MG PO TABS: 100.0000 mg | ORAL_TABLET | Freq: Three times a day (TID) | ORAL | 0 refills | Status: DC | PRN

## 2022-06-09 NOTE — ED Provider Notes (Signed)
[name]    CSN: [id number] Arrival date & time: 06/09/22  1001      History   Chief Complaint Chief Complaint  Patient presents with   Dysuria    HPI Crystal [name] is a 52 y.o. female.    Dysuria  here for dysuria and urinary frequency and incomplete bladder emptying.  Symptoms began overnight last night.  No fever or chills or nausea or vomiting.    She is not allergic to any medicine    she is postmenopausal        Past Medical History:  Diagnosis Date   CIN I (cervical intraepithelial neoplasia I)    Pap 02/2019: ASCUS w/ high risk HPV   Depression    Elevated LDL cholesterol level    Obesity (BMI 30-39.9)    Post-menopausal bleeding    Pre-diabetes     Patient Active Problem List   Diagnosis Date Noted   Peripheral neuropathy 08/12/2021   Acute cystitis without hematuria 05/07/2020   Pre-diabetes 05/07/2020   Elevated LDL cholesterol level 05/07/2020   Obesity (BMI 30-39.9) 05/07/2020   Depression 05/07/2020   Healthcare maintenance 05/07/2020   ASCUS with positive high risk HPV cervical 03/17/2020   Postmenopausal bleeding 03/17/2020    Past Surgical History:  Procedure Laterality Date   CHOLECYSTECTOMY N/A 07/28/2012   Procedure: LAPAROSCOPIC CHOLECYSTECTOMY WITH INTRAOPERATIVE CHOLANGIOGRAM;  Surgeon: Wilmon Arms. Corliss Skains, MD;  Location: [facility];  Service: General;  Laterality: N/A;   TUBAL LIGATION     7 years ago    OB History     Gravida  6   Para  4   Term  4   Preterm      AB  1   Living  5      SAB      IAB      Ectopic      Multiple  1   Live Births  5            Home Medications    Prior to Admission medications   Medication Sig Start Date End Date Taking? Authorizing Provider  ciprofloxacin (CIPRO) 500 MG tablet Take 1 tablet (500 mg total) by mouth 2 (two) times daily for 7 days. 06/09/22 06/16/22 Yes Zenia Resides, MD  phenazopyridine (PYRIDIUM) 100 MG tablet Take 1 tablet  (100 mg total) by mouth 3 (three) times daily as needed (urinary pain). 06/09/22  Yes Zenia Resides, MD  fluticasone (FLONASE) 50 MCG/ACT nasal spray Place into the nose. 07/12/21 07/12/22  [provider]    Family History Family History  Problem Relation Age of Onset   Hypertension Mother    Diabetes Mother    Heart disease Father    Diabetes Father    Diabetes Sister    Diabetes Sister    Cancer Maternal Aunt        uterine   Diabetes Brother    Anesthesia problems Neg Hx    Breast cancer Neg Hx     Social History Social History   Tobacco Use   Smoking status: Never   Smokeless tobacco: Never  Vaping Use   Vaping Use: Never used  Substance Use Topics   Alcohol use: No   Drug use: No     Allergies   Patient has no known allergies.   Review of Systems Review of Systems  Genitourinary:  Positive for dysuria.     Physical Exam Triage Vital Signs ED Triage Vitals  Enc Vitals  Group     BP 06/09/22 1039 119/78     Pulse Rate 06/09/22 1039 65     Resp 06/09/22 1039 18     Temp 06/09/22 1039 97.9 F (36.6 C)     Temp Source 06/09/22 1039 Oral     SpO2 06/09/22 1039 97 %     Weight --      Height --      Head Circumference --      Peak Flow --      Pain Score 06/09/22 1038 0     Pain Loc --      Pain Edu? --      Excl. in [place]? --    No data found.  Updated Vital Signs BP 119/78 (BP Location: Left Arm)   Pulse 65   Temp 97.9 F (36.6 C) (Oral)   Resp 18   LMP 12/10/2019 (Exact Date)   SpO2 97%   Visual Acuity Right Eye Distance:   Left Eye Distance:   Bilateral Distance:    Right Eye Near:   Left Eye Near:    Bilateral Near:     Physical Exam Vitals reviewed.  Constitutional:      General: She is not in acute distress.    Appearance: She is not ill-appearing, toxic-appearing or diaphoretic.  HENT:     Mouth/Throat:     Mouth: Mucous membranes are moist.  Eyes:     Extraocular Movements: Extraocular movements intact.      Conjunctiva/sclera: Conjunctivae normal.     Pupils: Pupils are equal, round, and reactive to light.  Cardiovascular:     Rate and Rhythm: Normal rate and regular rhythm.     Heart sounds: No murmur heard. Pulmonary:     Effort: Pulmonary effort is normal.     Breath sounds: Normal breath sounds.  Abdominal:     Palpations: Abdomen is soft.     Tenderness: There is no abdominal tenderness. There is no right CVA tenderness or left CVA tenderness.  Musculoskeletal:     Cervical back: Neck supple.  Lymphadenopathy:     Cervical: No cervical adenopathy.  Neurological:     Mental Status: She is alert and oriented to person, place, and time.  Psychiatric:        Behavior: Behavior normal.      UC Treatments / Results  Labs (all labs ordered are listed, but only abnormal results are displayed) Labs Reviewed  POCT URINALYSIS DIP (MANUAL ENTRY) - Abnormal; Notable for the following components:      Result Value   Blood, UA trace-intact (*)    Leukocytes, UA Trace (*)    All other components within normal limits  URINE CULTURE    EKG   Radiology No results found.  Procedures Procedures (including critical care time)  Medications Ordered in UC Medications - No data to display  Initial Impression / Assessment and Plan / UC Course  I have reviewed the triage vital signs and the nursing notes.  Pertinent labs & imaging results that were available during my care of the patient were reviewed by me and considered in my medical decision making (see chart for details).        Her symptoms are typical for UTI.  Urine culture is sent.  UA shows small amount of leukocytes and red blood cells.  Cipro was sent to treat the UTI and Pyridium sent to treat the symptoms. Final Clinical Impressions(s) / UC Diagnoses   Final diagnoses:  Cystitis  Discharge Instructions      The urinalysis showed a little bit of blood and white blood cells.  Urine culture is sent(En la orina  hay un poquito de globulos rojos y blancos; puede ser senal de infeccion en la orina. Hemos mandado una cultiva)  Take Cipro 500 mg--1 tablet 2 times daily for 7 days(Tome 1 tableta por la boca 2 veces al dia por 7 dias)  Take Pyridium/phenazopyridine 100 mg--1 tablet 3 times daily as needed for urinary pain.  This medication usually makes the urine orange (tome 1 tableta por la boca 3 veces al dia cuando tiene ardor al [employer])      ED Prescriptions     Medication Sig Dispense Auth. Provider   ciprofloxacin (CIPRO) 500 MG tablet Take 1 tablet (500 mg total) by mouth 2 (two) times daily for 7 days. 14 tablet Tayquan Gassman, Janace Aris, MD   phenazopyridine (PYRIDIUM) 100 MG tablet Take 1 tablet (100 mg total) by mouth 3 (three) times daily as needed (urinary pain). 10 tablet Marlinda Mike Janace Aris, MD      PDMP not reviewed this encounter.   Zenia Resides, MD 06/09/22 [phone]

## 2022-06-09 NOTE — Discharge Instructions (Signed)
The urinalysis showed a little bit of blood and white blood cells.  Urine culture is sent(En la orina hay un poquito de globulos rojos y blancos; puede ser senal de infeccion en la orina. Hemos mandado una cultiva)  Take Cipro 500 mg--1 tablet 2 times daily for 7 days(Tome 1 tableta por la boca 2 veces al dia por 7 dias)  Take Pyridium/phenazopyridine 100 mg--1 tablet 3 times daily as needed for urinary pain.  This medication usually makes the urine orange (tome 1 tableta por la boca 3 veces al dia cuando tiene ardor al [employer])

## 2022-06-09 NOTE — ED Triage Notes (Signed)
Pt presents with c/o vaginal irritation and dysuria X 2 days.   Pt denies abd pain.

## 2022-06-11 LAB — URINE CULTURE: Culture: <10000 — AB

## 2022-06-12 ENCOUNTER — Other Ambulatory Visit: Payer: Self-pay

## 2022-06-12 ENCOUNTER — Ambulatory Visit: Payer: Self-pay | Admitting: Hematology and Oncology

## 2022-06-12 ENCOUNTER — Ambulatory Visit
Admission: RE | Admit: 2022-06-12 | Discharge: 2022-06-12 | Disposition: A | Discharge status: Home / Self Care | Payer: No Typology Code available for payment source | Source: Ambulatory Visit | Attending: Obstetrics and Gynecology | Admitting: Obstetrics and Gynecology

## 2022-06-12 VITALS — BP 116/78 | Wt 199.6 lb

## 2022-06-12 DIAGNOSIS — Z124 Encounter for screening for malignant neoplasm of cervix: Secondary | ICD-10-CM

## 2022-06-12 DIAGNOSIS — Z01419 Encounter for gynecological examination (general) (routine) without abnormal findings: Secondary | ICD-10-CM

## 2022-06-12 DIAGNOSIS — Z1231 Encounter for screening mammogram for malignant neoplasm of breast: Secondary | ICD-10-CM

## 2022-06-12 DIAGNOSIS — Z1211 Encounter for screening for malignant neoplasm of colon: Secondary | ICD-10-CM

## 2022-06-12 NOTE — Patient Instructions (Signed)
[name] about self breast awareness and gave educational materials to take home. Patient did need a Pap smear today due to last Pap smear was in 05/30/2021 per patient. [name] [name] Appointment scheduled for 06/12/22. Patient aware of appointment and will be there. [name] Crystal [name] verbalized understanding.  Pascal Lux, NP 2:15 PM

## 2022-06-12 NOTE — Progress Notes (Signed)
[name] is a 52 y.o. [id number] female who presents to [facility] clinic today with no complaints.    Pap Smear: Pap smear completed today. Last Pap smear was 05/30/21 and was normal. Per patient has history of an abnormal Pap smear. Last Pap smear result is available in Epic.   Physical exam: Breasts Breasts symmetrical. No skin abnormalities bilateral breasts. No nipple retraction bilateral breasts. No nipple discharge bilateral breasts. No lymphadenopathy. No lumps palpated bilateral breasts. MS DIGITAL SCREENING TOMO BILATERAL  Result Date: 06/03/2021 CLINICAL DATA:  Screening. EXAM: DIGITAL SCREENING BILATERAL MAMMOGRAM WITH TOMOSYNTHESIS AND CAD TECHNIQUE: Bilateral screening digital craniocaudal and mediolateral oblique mammograms were obtained. Bilateral screening digital breast tomosynthesis was performed. The images were evaluated with computer-aided detection. COMPARISON:  Previous exam(s). ACR Breast Density Category c: The breast tissue is heterogeneously dense, which may obscure small masses. FINDINGS: There are no findings suspicious for malignancy. IMPRESSION: No mammographic evidence of malignancy. A result letter of this screening mammogram will be mailed directly to the patient. RECOMMENDATION: Screening mammogram in one year. (Code:SM-B-01Y) BI-RADS CATEGORY  1: Negative. Electronically Signed   By: Bary Richard M.D.   On: 06/03/2021 07:46   MS DIGITAL SCREENING TOMO BILATERAL  Result Date: 03/16/2020 CLINICAL DATA:  Screening. EXAM: DIGITAL SCREENING BILATERAL MAMMOGRAM WITH TOMOSYNTHESIS AND CAD TECHNIQUE: Bilateral screening digital craniocaudal and mediolateral oblique mammograms were obtained. Bilateral screening digital breast tomosynthesis was performed. The images were evaluated with computer-aided detection. COMPARISON:  Previous exam(s). ACR Breast Density Category c: The breast tissue is heterogeneously dense, which may obscure small masses. FINDINGS: There are  no findings suspicious for malignancy. IMPRESSION: No mammographic evidence of malignancy. A result letter of this screening mammogram will be mailed directly to the patient. RECOMMENDATION: Screening mammogram in one year. (Code:SM-B-01Y) BI-RADS CATEGORY  1: Negative. Electronically Signed   By: Norva Pavlov M.D.   On: 03/16/2020 13:47   MS DIGITAL DIAG TOMO UNI LEFT  Result Date: 09/20/2019 CLINICAL DATA:  52 yr old female presenting with diffuse intermittent pain throughout the lateral aspect of the left breast. This has been going on for approximately 1 month. EXAM: DIGITAL DIAGNOSTIC UNILATERAL LEFT MAMMOGRAM WITH TOMO AND CAD COMPARISON:  Previous exam(s). ACR Breast Density Category d: The breast tissue is extremely dense, which lowers the sensitivity of mammography. FINDINGS: Full field tomosynthesis views of the left breast were performed. There is no new abnormality in the left breast to explain the patient's diffuse pain. No suspicious mass, distortion, or microcalcifications are identified to suggest presence of malignancy. Mammographic images were processed with CAD. IMPRESSION: No mammographic evidence of malignancy or other finding in the left breast to explain the patient's diffuse breast pain. RECOMMENDATION: 1. Clinical follow-up as needed for the left breast pain. We discussed some of the issues regarding breast pain, including limiting caffeine, wearing adequate support, over-the-counter pain medication, low-fat diet, exercise, and ice as needed. 2. Return for annual screening mammography which will be due in February 2022. I have discussed the findings and recommendations with the patient. If applicable, a reminder letter will be sent to the patient regarding the next appointment. BI-RADS CATEGORY  1: Negative. Electronically Signed   By: Emmaline Kluver M.D.   On: 09/20/2019 14:29   MS DIGITAL SCREENING TOMO BILATERAL  Result Date: 03/08/2019 CLINICAL DATA:  Screening. EXAM:  DIGITAL SCREENING BILATERAL MAMMOGRAM WITH TOMO AND CAD COMPARISON:  Previous exam(s). ACR Breast Density Category c: The breast tissue is heterogeneously dense, which may obscure small  masses. FINDINGS: There are no findings suspicious for malignancy. Images were processed with CAD. IMPRESSION: No mammographic evidence of malignancy. A result letter of this screening mammogram will be mailed directly to the patient. RECOMMENDATION: Screening mammogram in one year. (Code:SM-B-01Y) BI-RADS CATEGORY  1: Negative. Electronically Signed   By: Emmaline Kluver M.D.   On: 03/08/2019 12:06   MS DIGITAL DIAG TOMO BILAT  Result Date: 02/19/2018 CLINICAL DATA:  52 year old patient with palpable area of concern in the 3 o'clock region of left breast. She has palpated a possible lump for approximately 1 month. A Spanish interpreter was present today during the patient's visit to assist with giving results and answering the patient's questions. EXAM: DIGITAL DIAGNOSTIC BILATERAL MAMMOGRAM WITH CAD AND TOMO ULTRASOUND LEFT BREAST COMPARISON:  Previous exam(s). ACR Breast Density Category c: The breast tissue is heterogeneously dense, which may obscure small masses. FINDINGS: Metallic skin marker placed in the region of patient concern 3 o'clock left breast. The parenchymal pattern of both breasts is stable. No mass, distortion, or suspicious microcalcification is identified in either breast to suggest malignancy. Mammographic images were processed with CAD. On physical exam, normal tissue is palpated in the outer left breast 3 o'clock region. I do not palpate a discrete lump thickening. The skin appears normal. Targeted ultrasound is performed, showing normal fibroglandular tissue in the outer left breast in the region of patient concern. IMPRESSION: No evidence of malignancy in either breast. RECOMMENDATION: Screening mammogram in one year.(Code:SM-B-01Y) I have discussed the findings and recommendations with the patient.  Results were also provided in writing at the conclusion of the visit. If applicable, a reminder letter will be sent to the patient regarding the next appointment. BI-RADS CATEGORY  1: Negative. Electronically Signed   By: Britta Mccreedy M.D.   On: 02/19/2018 10:28          Pelvic/Bimanual Ext Genitalia No lesions, no swelling and no discharge observed on external genitalia.        Vagina Vagina pink and normal texture. No lesions or discharge observed in vagina.        Cervix Cervix is present. Cervix pink and of normal texture. No discharge observed.    Uterus Uterus is present and palpable. Uterus in normal position and normal size.        Adnexae Bilateral ovaries present and palpable. No tenderness on palpation.         Rectovaginal No rectal exam completed today since patient had no rectal complaints. No skin abnormalities observed on exam.     Smoking History: Patient has never smoked and was not referred to quit line.    Patient Navigation: Patient education provided. Access to services provided for patient through B[facility]CP program. Natale Lay interpreter provided. No transportation provided   Colorectal Cancer Screening: Per patient had colonoscopy within the last year. Will request results.  No complaints today.    Breast and Cervical Cancer Risk Assessment: Patient does not have family history of breast cancer, known genetic mutations, or radiation treatment to the chest before age 17. Patient does not have history of cervical dysplasia, immunocompromised, or DES exposure in-utero.  Risk Assessment   No risk assessment data for the current encounter  Risk Scores       05/30/2021   Last edited by: Narda Rutherford, LPN   5-year risk: 0.6 %   Lifetime risk: 5.7 %            A: B[facility]CP exam with pap smear No complaints  with benign exam.   P: Referred patient to the [facility] for a screening mammogram. Appointment scheduled  06/12/22.  Ilda Basset A, NP 06/12/2022 2:15 PM

## 2022-06-18 LAB — CYTOLOGY - PAP
Comment: NEGATIVE
Diagnosis: NEGATIVE
High risk HPV: NEGATIVE

## 2022-07-23 ENCOUNTER — Inpatient Hospital Stay: Payer: Managed Care, Other (non HMO) | Attending: Obstetrics and Gynecology | Admitting: *Deleted

## 2022-07-23 ENCOUNTER — Other Ambulatory Visit: Payer: Self-pay

## 2022-07-23 VITALS — BP 124/82 | Ht 64.0 in | Wt 200.3 lb

## 2022-07-23 DIAGNOSIS — Z0000 Encounter for general adult medical examination without abnormal findings: Secondary | ICD-10-CM

## 2022-07-23 NOTE — Progress Notes (Signed)
[facility] initial screening   Interpreter- Natale Lay, Haroldine Laws   Clinical Measurement:  Vitals:   07/23/22 0837  BP: 114/76   Fasting Labs Drawn Today, will review with patient when they result.   Medical History: Patient states that she does not have high cholesterol, does not have high blood pressure and she does not have diabetes.  Medications: Patient states that she does not take medication to lower cholesterol, blood pressure or blood sugar.  Patient does not take an aspirin a day to help prevent a heart attack or stroke.    Blood pressure, self measurement: Patient states that she does not measure blood pressure from home. She checks her blood pressure N/A. She shares her readings with a health care provider: N/A.   Nutrition: Patient states that on average she eats 2 cups of fruit and 1 cups of vegetables per day. Patient states that she does not eat fish at least 2 times per week. Patient eats more than half servings of whole grains. Patient drinks less than 36 ounces of beverages with added sugar weekly: yes. Patient is currently watching sodium or salt intake: yes. In the past 7 days patient has consumed drinks containing alcohol on 0 days. On a day that patient consumes drinks containing alcohol on average 0 drinks are consumed.      Physical activity: Patient states that she gets 180 minutes of moderate and 180 minutes of vigorous physical activity each week.  Smoking status: Patient states that she has has never smoked .   Quality of life: Over the past 2 weeks patient states that she had little interest or pleasure in doing things: not at all. She has been feeling down, depressed or hopeless:not at all.   Social Determinants of Health Assessment:   Computer Use: During the last 12 months patient states that she has used any of the following: desktop/laptop, smart phone or tablet/other portable wireless computer: yes.   Internet Use: During the last 12 months, did you or  any member of your household have access to the internet: Yes, by paying a cell phone company or internet service provider.   Food Insecurities: During the last 12 months, where there any times when you were worried that you would run out of food because of a lack of money or other resources: No.   Transportation Barriers: During the last 12 months, have you missed a doctor's appointment because of transportation problems: No.   Childcare Barriers: If you are currently using childcare services, please identify  the type of services you use. (If not using childcare services, please select "Not applicable"): not applicable. During the last 12 months, have you had any barriers to childcare services such as: not applicable.   Housing: What is your housing situation today: I have housing.   Intimate Partner Violence: During the last 12 months, how often did your partner physically hurt you: never. During the last 12 months, how often did your partner insult you or talk down to you: never.  Medication Adherence: During the last 12 months, did you ever forget to take your medicine: not applicable. During the last 12 months, were you careless ar times about taking your medicine: not applicable. During the last 12 months, when you felt better did you sometimes stop taking your medication: not applicable. During the last 12 months, sometimes if you felt worse when you took your medicine did you stop taking it: not applicable.   Risk reduction and counseling:  Spoke with patient  about the daily recommendations for fruits and vegetables. Reviewed with patient what a serving size would look like. Spoke with patient about increasing her heart healthy fish intake so that she is getting at least a serving in her diet each week. Patient is eating a variety of whole grains. Patient has been walking 3-4 days a week and has been going to the gym 3-4 days per week. Patient is getting a combination of lower and higher  intensity workouts during her time at the gym.  Health Coaching: vegetable and 1 serving of fish weekly  Goal: Patient will add in a serving of vegetables into her diet on the days of the week that she does not make her morning fruit/vegetable juice and will add in one serving of salmon each week. Patient will work on reaching this goal over the next month.    Navigation:  I will notify patient of lab results.  Patient is aware of 2 more health coaching sessions and a follow up.  Time: 20 minutes

## 2022-07-24 LAB — LIPID PANEL
Chol/HDL Ratio: 3.3 ratio (ref 0.0–4.4)
Cholesterol, Total: 176 mg/dL (ref 100–199)
HDL: 54 mg/dL (ref >39)
LDL Chol Calc (NIH): 99 mg/dL (ref 0–99)
Triglycerides: 128 mg/dL (ref 0–149)
VLDL Cholesterol Cal: 23 mg/dL (ref 5–40)

## 2022-07-24 LAB — GLUCOSE, RANDOM: Glucose: 101 mg/dL — ABNORMAL HIGH (ref 70–99)

## 2022-07-24 LAB — HEMOGLOBIN A1C
Est. average glucose Bld gHb Est-mCnc: 128 mg/dL
Hgb A1c MFr Bld: 6.1 % — ABNORMAL HIGH (ref 4.8–5.6)

## 2022-07-30 ENCOUNTER — Telehealth: Payer: Self-pay

## 2022-07-30 NOTE — Telephone Encounter (Signed)
Health coaching 2   interpreter- [employer] [phone]   Labs- 176 cholesterol, 99 LDL cholesterol, 128 triglycerides, 54 HDL cholesterol, 6.1 hemoglobin A1C, 101 mean plasma glucose. Patient understands and is aware of her lab results.   Goals-  1. Reduce the amount of sweet and sugary foods and drinks consumed. 2. Reduce the amount of carbohydrate rich foods consumed. 3. Continue with daily exercise for 20-30 minutes.   Navigation:  Patient is aware of 1 more health coaching sessions and a follow up. Will refer patient to Internal Medicine for FU for elevated labs.  Time- 12 minutes

## 2022-08-01 ENCOUNTER — Ambulatory Visit
Admission: EM | Admit: 2022-08-01 | Discharge: 2022-08-01 | Disposition: A | Discharge status: Home / Self Care | Payer: Managed Care, Other (non HMO)

## 2022-08-01 ENCOUNTER — Ambulatory Visit (INDEPENDENT_AMBULATORY_CARE_PROVIDER_SITE_OTHER): Payer: Managed Care, Other (non HMO)

## 2022-08-01 DIAGNOSIS — B349 Viral infection, unspecified: Secondary | ICD-10-CM

## 2022-08-01 MED ORDER — BENZONATATE 100 MG PO CAPS: 100.0000 mg | ORAL_CAPSULE | Freq: Four times a day (QID) | ORAL | 0 refills | Status: DC | PRN

## 2022-08-01 NOTE — ED Triage Notes (Signed)
Due to language barrier, an interpreter was present during the history-taking and subsequent discussion (and for part of the physical exam) with this patient. Climente, Number: [id number].   "Started with Fever & Flu like symptoms such as cogestion, runny nose, x2 days ago". "Now I have a pretty bad Cough". "Fever degree unknown".

## 2022-08-01 NOTE — ED Provider Notes (Signed)
[name]    CSN: [id number] Arrival date & time: 08/01/22  1643      History   Chief Complaint Chief Complaint  Patient presents with   Fever    With Cough    HPI Crystal [name] is a 52 y.o. female.   Patient complains of a cough and congestion for the past 5 days.  Patient reports head congestion and frequent coughing.  Patient denies fever she denies chills  The history is provided by the patient. No language interpreter was used.  Fever Onset quality:  Gradual Duration:  5 days Timing:  Constant Chronicity:  New Relieved by:  Nothing Worsened by:  Nothing Risk factors: no recent sickness     Past Medical History:  Diagnosis Date   CIN I (cervical intraepithelial neoplasia I)    Pap 02/2019: ASCUS w/ high risk HPV   Depression    Elevated LDL cholesterol level    Obesity (BMI 30-39.9)    Post-menopausal bleeding    Pre-diabetes     Patient Active Problem List   Diagnosis Date Noted   Peripheral neuropathy 08/12/2021   Acute cystitis without hematuria 05/07/2020   Pre-diabetes 05/07/2020   Elevated LDL cholesterol level 05/07/2020   Obesity (BMI 30-39.9) 05/07/2020   Depression 05/07/2020   Healthcare maintenance 05/07/2020   ASCUS with positive high risk HPV cervical 03/17/2020   Postmenopausal bleeding 03/17/2020    Past Surgical History:  Procedure Laterality Date   CHOLECYSTECTOMY N/A 07/28/2012   Procedure: LAPAROSCOPIC CHOLECYSTECTOMY WITH INTRAOPERATIVE CHOLANGIOGRAM;  Surgeon: Wilmon Arms. Corliss Skains, MD;  Location: [facility] OR;  Service: General;  Laterality: N/A;   TUBAL LIGATION     7 years ago    OB History     Gravida  6   Para  4   Term  4   Preterm      AB  1   Living  5      SAB      IAB      Ectopic      Multiple  1   Live Births  5            Home Medications    Prior to Admission medications   Medication Sig Start Date End Date Taking? Authorizing Provider  benzonatate (TESSALON PERLES) 100  MG capsule Take 1 capsule (100 mg total) by mouth every 6 (six) hours as needed for cough. 08/01/22 08/01/23 Yes Cheron Schaumann K, PA-C  diphenhydrAMINE HCl (THERAFLU MULTI SYMPTOM PO) Take by mouth.   Yes [provider]  ibuprofen (ADVIL) 200 MG tablet Take 200 mg by mouth every 6 (six) hours as needed.   Yes [provider]  acetaminophen (TYLENOL) 325 MG tablet Take by mouth. Patient not taking: Reported on 07/23/2022 09/26/15   [provider]  Azelastine HCl 137 MCG/SPRAY SOLN rociar ONE rociada IN EACH NOSTRIL TWICE DAILY AS DIRECTED    [provider]    Family History Family History  Problem Relation Age of Onset   Hypertension Mother    Diabetes Mother    Heart disease Father    Diabetes Father    Diabetes Sister    Diabetes Sister    Cancer Maternal Aunt        uterine   Diabetes Brother    Anesthesia problems Neg Hx    Breast cancer Neg Hx     Social History Social History   Tobacco Use   Smoking status: Never   Smokeless tobacco:  Never  Vaping Use   Vaping Use: Never used  Substance Use Topics   Alcohol use: Not Currently   Drug use: Never     Allergies   Patient has no known allergies.   Review of Systems Review of Systems  Constitutional:  Positive for fever.  All other systems reviewed and are negative.    Physical Exam Triage Vital Signs ED Triage Vitals  Enc Vitals Group     BP 08/01/22 1736 (!) 138/90     Pulse Rate 08/01/22 1736 85     Resp 08/01/22 1736 18     Temp 08/01/22 1736 98.4 F (36.9 C)     Temp Source 08/01/22 1736 Oral     SpO2 08/01/22 1736 98 %     Weight 08/01/22 1733 200 lb 6.4 oz (90.9 kg)     Height 08/01/22 1733 5\' 4"  (1.626 m)     Head Circumference --      Peak Flow --      Pain Score 08/01/22 1732 0     Pain Loc --      Pain Edu? --      Excl. in GC? --    No data found.  Updated Vital Signs BP (!) 137/92 (BP Location: Left Arm)   Pulse 85   Temp 98.4 F (36.9 C) (Oral)    Resp 18   Ht 5\' 4"  (1.626 m)   Wt 90.9 kg   LMP 12/10/2019 (Exact Date)   SpO2 98%   BMI 34.40 kg/m   Visual Acuity Right Eye Distance:   Left Eye Distance:   Bilateral Distance:    Right Eye Near:   Left Eye Near:    Bilateral Near:     Physical Exam Vitals and nursing note reviewed.  Constitutional:      Appearance: She is well-developed.  HENT:     Head: Normocephalic.     Mouth/Throat:     Mouth: Mucous membranes are moist.  Eyes:     Pupils: Pupils are equal, round, and reactive to light.  Cardiovascular:     Rate and Rhythm: Normal rate.  Pulmonary:     Effort: Pulmonary effort is normal.  Abdominal:     General: There is no distension.  Musculoskeletal:        General: Normal range of motion.     Cervical back: Normal range of motion.  Neurological:     General: No focal deficit present.     Mental Status: She is alert and oriented to person, place, and time.  Psychiatric:        Mood and Affect: Mood normal.      UC Treatments / Results  Labs (all labs ordered are listed, but only abnormal results are displayed) Labs Reviewed - No data to display  EKG   Radiology DG Chest 2 View  Result Date: 08/01/2022 CLINICAL DATA:  Cough, fever and runny nose. EXAM: CHEST - 2 VIEW COMPARISON:  03/24/2022 FINDINGS: The cardiomediastinal contours are normal. Resolved bronchial thickening from prior. Pulmonary vasculature is normal. No consolidation, pleural effusion, or pneumothorax. No acute osseous abnormalities are seen. IMPRESSION: No acute chest findings or explanation for cough. Electronically Signed   By: Narda Rutherford M.D.   On: 08/01/2022 18:36    Procedures Procedures (including critical care time)  Medications Ordered in UC Medications - No data to display  Initial Impression / Assessment and Plan / UC Course  I have reviewed the triage vital signs and the  nursing notes.  Pertinent labs & imaging results that were available during my care of  the patient were reviewed by me and considered in my medical decision making (see chart for details).    Chest x-ray is negative Patient counseled on viral illness.  Patient given Tessalon to help with cough Final Clinical Impressions(s) / UC Diagnoses   Final diagnoses:  Viral illness     Discharge Instructions      Your chest xray is normal.  Tylenol for fever    ED Prescriptions     Medication Sig Dispense Auth. Provider   benzonatate (TESSALON PERLES) 100 MG capsule Take 1 capsule (100 mg total) by mouth every 6 (six) hours as needed for cough. 30 capsule Elson Areas, [place]      PDMP not reviewed this encounter. An After Visit Summary was printed and given to the patient.       Elson Areas, [place] 08/01/22 [phone]

## 2022-08-01 NOTE — Discharge Instructions (Addendum)
Your chest xray is normal.  Tylenol for fever

## 2022-08-02 ENCOUNTER — Emergency Department (HOSPITAL_COMMUNITY)
Admission: EM | Admit: 2022-08-02 | Discharge: 2022-08-03 | Disposition: A | Discharge status: Home / Self Care | Payer: Managed Care, Other (non HMO) | Attending: Emergency Medicine | Admitting: Emergency Medicine

## 2022-08-02 ENCOUNTER — Other Ambulatory Visit: Payer: Self-pay

## 2022-08-02 ENCOUNTER — Emergency Department (HOSPITAL_COMMUNITY): Payer: Managed Care, Other (non HMO)

## 2022-08-02 DIAGNOSIS — R051 Acute cough: Secondary | ICD-10-CM | POA: Insufficient documentation

## 2022-08-02 DIAGNOSIS — Z20822 Contact with and (suspected) exposure to covid-19: Secondary | ICD-10-CM | POA: Insufficient documentation

## 2022-08-02 DIAGNOSIS — R059 Cough, unspecified: Secondary | ICD-10-CM | POA: Diagnosis present

## 2022-08-02 LAB — URINALYSIS, ROUTINE W REFLEX MICROSCOPIC
Bilirubin Urine: NEGATIVE
Glucose, UA: NEGATIVE mg/dL
Hgb urine dipstick: NEGATIVE
Ketones, ur: NEGATIVE mg/dL
Leukocytes,Ua: NEGATIVE
Nitrite: NEGATIVE
Protein, ur: NEGATIVE mg/dL
Specific Gravity, Urine: 1.01 (ref 1.005–1.030)
pH: 8 (ref 5.0–8.0)

## 2022-08-02 LAB — SARS CORONAVIRUS 2 BY RT PCR: SARS Coronavirus 2 by RT PCR: NEGATIVE

## 2022-08-02 NOTE — ED Provider Triage Note (Signed)
Emergency Medicine Provider Triage Evaluation Note  Crystal [name] , a 52 y.o. female  was evaluated in triage.  Pt complains of cough, congestion, rhinorrhea, fevers, body aches.  Symptoms have been present for 6 days.  No known sick contacts.  She reports low back pain as well.  No urinary symptoms.  She was seen in urgent care yesterday and had a negative chest x-ray.  Review of Systems  Positive: As above Negative: As above  Physical Exam  BP (!) 148/89   Pulse (!) 112   Temp 100.3 F (37.9 C) (Oral)   Resp 18   LMP 12/10/2019 (Exact Date)   SpO2 97%  Gen:   Awake, no distress   Resp:  Normal effort  MSK:   Moves extremities without difficulty  Other:  Congestion and rhinorrhea present  Medical Decision Making  Medically screening exam initiated at 7:56 PM.  Appropriate orders placed.  Stephenie Larabee was informed that the remainder of the evaluation will be completed by another provider, this initial triage assessment does not replace that evaluation, and the importance of remaining in the ED until their evaluation is complete.  Labs ordered   Mora Bellman 08/02/22 1610

## 2022-08-02 NOTE — ED Triage Notes (Signed)
Patient reports productive cough , nasal congestion , body aches and fever onset this week .

## 2022-08-03 MED ORDER — IPRATROPIUM-ALBUTEROL 0.5-2.5 (3) MG/3ML IN SOLN
3.0000 mL | Freq: Once | RESPIRATORY_TRACT | Status: AC
  Administered 2022-08-03: 3 mL via RESPIRATORY_TRACT
  Filled 2022-08-03: qty 3

## 2022-08-03 MED ORDER — ALBUTEROL SULFATE HFA 108 (90 BASE) MCG/ACT IN AERS: 1.0000 | INHALATION_SPRAY | RESPIRATORY_TRACT | Status: DC | PRN

## 2022-08-03 MED ORDER — ALBUTEROL SULFATE HFA 108 (90 BASE) MCG/ACT IN AERS
1.0000 | INHALATION_SPRAY | RESPIRATORY_TRACT | Status: DC
  Filled 2022-08-03: qty 6.7

## 2022-08-03 MED ORDER — AZITHROMYCIN 250 MG PO TABS
500.0000 mg | ORAL_TABLET | Freq: Once | ORAL | Status: AC
  Administered 2022-08-03: 500 mg via ORAL
  Filled 2022-08-03: qty 2

## 2022-08-03 MED ORDER — ACETAMINOPHEN 500 MG PO TABS
1000.0000 mg | ORAL_TABLET | Freq: Once | ORAL | Status: AC
  Administered 2022-08-03: 1000 mg via ORAL
  Filled 2022-08-03: qty 2

## 2022-08-03 MED ORDER — ALBUTEROL SULFATE (2.5 MG/3ML) 0.083% IN NEBU: 3.0000 mL | INHALATION_SOLUTION | RESPIRATORY_TRACT | Status: DC | PRN

## 2022-08-03 MED ORDER — AZITHROMYCIN 250 MG PO TABS: 250.0000 mg | ORAL_TABLET | Freq: Every day | ORAL | 0 refills | Status: DC

## 2022-08-03 MED ORDER — ALBUTEROL SULFATE HFA 108 (90 BASE) MCG/ACT IN AERS: 1.0000 | INHALATION_SPRAY | RESPIRATORY_TRACT | Status: DC

## 2022-08-03 NOTE — Discharge Instructions (Signed)
Take antibiotics as directed.  Use the inhaler, 1 puff every 4 hours for the next 2 days.  Return for worsening symptoms.  Take Tylenol for fever.

## 2022-08-03 NOTE — ED Provider Notes (Signed)
[facility] Emergency Department Provider Note MRN:  [id number]  Arrival date & time: 08/03/22     Chief Complaint   Cough/Body Aches   History of Present Illness   Crystal [name] is a 52 y.o. year-old female presents to the ED with chief complaint of cough, fever, congestion for the past 6 days.  She states that her symptoms have been persistent.  She reports productive cough.  Was seen at urgent care a couple of days ago and had normal CXR and was rx'd tessalon.  No improvement.  History provided by patient. Additional independent history provided by family member, who assists with translation since no interpreter can be found at this time.   Review of Systems  Pertinent positive and negative review of systems noted in HPI.    Physical Exam   Vitals:   08/03/22 0021 08/03/22 0022  BP:  130/80  Pulse:  90  Resp:  16  Temp:  98 F (36.7 C)  SpO2: 100% 98%    CONSTITUTIONAL:  non toxic-appearing, NAD NEURO:  Alert and oriented x 3, CN 3-12 grossly intact EYES:  eyes equal and reactive ENT/NECK:  Supple, no stridor, mildly erythematous oropharynx, normal TMs CARDIO:  mildly tachycardic, regular rhythm, appears well-perfused  PULM:  No respiratory distress, wheezing present in LLL GI/GU:  non-distended,  MSK/SPINE:  No gross deformities, no edema, moves all extremities  SKIN:  no rash, atraumatic   *Additional and/or pertinent findings included in MDM below  Diagnostic and Interventional Summary    EKG Interpretation Date/Time:    Ventricular Rate:    PR Interval:    QRS Duration:    QT Interval:    QTC Calculation:   R Axis:      Text Interpretation:         Labs Reviewed  SARS CORONAVIRUS 2 BY RT PCR  URINALYSIS, ROUTINE W REFLEX MICROSCOPIC    DG Chest 2 View  Final Result      Medications  acetaminophen (TYLENOL) tablet 1,000 mg (1,000 mg Oral Given 08/03/22 0043)  ipratropium-albuterol (DUONEB) 0.5-2.5 (3) MG/3ML  nebulizer solution 3 mL (3 mLs Nebulization Given 08/03/22 0021)  azithromycin (ZITHROMAX) tablet 500 mg (500 mg Oral Given 08/03/22 0044)     Procedures  /  Critical Care Procedures  ED Course and Medical Decision Making  I have reviewed the triage vital signs, the nursing notes, and pertinent available records from the EMR.  Social Determinants Affecting Complexity of Care: Patient has no clinically significant social determinants affecting this chief complaint..   ED Course:    Medical Decision Making Amount and/or Complexity of Data Reviewed Radiology: ordered and independent interpretation performed.    Details: No opacity seen  Risk OTC drugs. Prescription drug management.         Consultants: No consultations were needed in caring for this patient.   Treatment and Plan: Emergency department workup does not suggest an emergent condition requiring admission or immediate intervention beyond  what has been performed at this time. The patient is safe for discharge and has  been instructed to return immediately for worsening symptoms, change in  symptoms or any other concerns    Final Clinical Impressions(s) / ED Diagnoses     ICD-10-CM   1. Acute cough  R05.1       ED Discharge Orders          Ordered    azithromycin (ZITHROMAX) 250 MG tablet  Daily  08/03/22 0058              Discharge Instructions Discussed with and Provided to Patient:     Discharge Instructions      Take antibiotics as directed.  Use the inhaler, 1 puff every 4 hours for the next 2 days.  Return for worsening symptoms.  Take Tylenol for fever.       Roxy Horseman, PA-C 08/03/22 0058    Melene Plan, DO 08/03/22 1610

## 2022-08-12 ENCOUNTER — Encounter: Payer: Self-pay | Admitting: Student

## 2022-08-12 ENCOUNTER — Other Ambulatory Visit: Payer: Self-pay

## 2022-08-12 ENCOUNTER — Ambulatory Visit (INDEPENDENT_AMBULATORY_CARE_PROVIDER_SITE_OTHER): Payer: Managed Care, Other (non HMO) | Admitting: Student

## 2022-08-12 VITALS — BP 119/79 | Temp 98.5°F | Ht 64.0 in | Wt 200.7 lb

## 2022-08-12 DIAGNOSIS — R7303 Prediabetes: Secondary | ICD-10-CM

## 2022-08-12 DIAGNOSIS — F3340 Major depressive disorder, recurrent, in remission, unspecified: Secondary | ICD-10-CM

## 2022-08-12 DIAGNOSIS — E7800 Pure hypercholesterolemia, unspecified: Secondary | ICD-10-CM

## 2022-08-12 DIAGNOSIS — Z0000 Encounter for general adult medical examination without abnormal findings: Secondary | ICD-10-CM

## 2022-08-12 DIAGNOSIS — E669 Obesity, unspecified: Secondary | ICD-10-CM

## 2022-08-12 DIAGNOSIS — J208 Acute bronchitis due to other specified organisms: Secondary | ICD-10-CM | POA: Insufficient documentation

## 2022-08-12 DIAGNOSIS — Z6830 Body mass index (BMI) 30.0-30.9, adult: Secondary | ICD-10-CM

## 2022-08-12 NOTE — Progress Notes (Signed)
    Subjective:  Crystal [name] is a 52 y.o. who presents to clinic for the following:  Follow up after ED visit on July 5 and 6 for cough with fever, workup suggestive of acute viral bronchitis.  Cough is improving, still bothersome at night, associated with some sputum production. No more fevers. Overall feeling much better.  Weight is increased from last visit. She attributes this to sedentary lifestyle. Recently changed jobs from one that required a lot of time on her feet to one that sees her sitting most of the time. Occasionally she overeats with family. She enjoys large sweetened coffee most days of the week.  Review of Systems  Constitutional:  Positive for fever. Negative for malaise/fatigue and weight loss.  Respiratory:  Positive for cough and sputum production. Negative for shortness of breath.   Cardiovascular:  Negative for chest pain and palpitations.  Gastrointestinal:  Negative for blood in stool and heartburn.  Genitourinary:  Negative for hematuria.  Psychiatric/Behavioral:  Positive for depression.    Objective:   Vitals:   08/12/22 1536  BP: 119/79  Temp: 98.5 F (36.9 C)  TempSrc: Oral  Weight: 200 lb 11.2 oz (91 kg)  Height: 5\' 4"  (1.626 m)    Physical Exam Well-appearing, no distress Oropharynx pink and moist Neck supple without palpable cervical lymphadenopathy or thyromegaly Heart rate is normal, rhythm is regular, no appreciable murmurs, no LE edema, DP pulses strong Breathing regular and unlabored, no wheezing or crackles Skin warm and dry Pleasant, concordant affect  Assessment & Plan:   Problem List Items Addressed This Visit     Pre-diabetes - Primary (Chronic)    Doing well, A1c essentially stable from 1 year ago. No medications for now. Continue lifestyle intervention.      Elevated LDL cholesterol level    LDL < 100 on last check. Overall ASCVD risk is 1%. Lifestyle management for now.      Obesity (BMI 30.0-34.9)  (Chronic)    Body mass index is 34.45 kg/m.  Counseled about nutrition including eliminating sugary beverages like sweetened coffee, soda, sweet tea, and juice from diet. Recommended more vegetables, lean protein, and legumes. Frozen vegetables are healthy and inexpensive. Black beans are a healthy and inexpensive source of lean protein and fiber. Recommend gradually increasing exercise. A daily walk is a great way to start an exercise program.  Referral: no  Pharmacological intervention: no       MDD (recurrent major depressive disorder) in remission (HCC) (Chronic)    PHQ-9 score of 4 today, symptoms not interfering with daily life.      Healthcare maintenance    Medication reconciliation completed: yes Medications present with patient: no Barriers to med rec: none Patient reminded to bring medications to all doctor visits.       Acute viral bronchitis    Resolving. Recommend trial of Nyquil for persistent nighttime cough.       Return in 3-6 months for weight check up.  Patient discussed with Dr. Lawernce Keas MD 08/12/2022, 5:02 PM  [phone]

## 2022-08-12 NOTE — Assessment & Plan Note (Addendum)
Resolving. Recommend trial of Nyquil for persistent nighttime cough.

## 2022-08-12 NOTE — Assessment & Plan Note (Signed)
LDL < 100 on last check. Overall ASCVD risk is 1%. Lifestyle management for now.

## 2022-08-12 NOTE — Assessment & Plan Note (Signed)
Medication reconciliation completed: yes Medications present with patient: no Barriers to med rec: none Patient reminded to bring medications to all doctor visits.

## 2022-08-12 NOTE — Assessment & Plan Note (Addendum)
Doing well, A1c essentially stable from 1 year ago. No medications for now. Continue lifestyle intervention.

## 2022-08-12 NOTE — Assessment & Plan Note (Signed)
PHQ-9 score of 4 today, symptoms not interfering with daily life.

## 2022-08-12 NOTE — Assessment & Plan Note (Addendum)
Body mass index is 34.45 kg/m.  Counseled about nutrition including eliminating sugary beverages like sweetened coffee, soda, sweet tea, and juice from diet. Recommended more vegetables, lean protein, and legumes. Frozen vegetables are healthy and inexpensive. Black beans are a healthy and inexpensive source of lean protein and fiber. Recommend gradually increasing exercise. A daily walk is a great way to start an exercise program.  Referral: no  Pharmacological intervention: no

## 2022-08-12 NOTE — Patient Instructions (Addendum)
For nighttime cough, try [facility] NyQuil Nighttime Cold & Flu Relief, Original Flavor, 12 fl oz   Remember to bring all of the medications that you take (including over the counter medications and supplements) with you to every clinic visit.  This after visit summary is an important review of tests, referrals, and medication changes that were discussed during your visit. If you have questions or concerns, call [phone]. Outside of clinic business hours, call the main hospital at [phone] and ask the operator for the on-call internal medicine resident.   Marrianne Mood MD 08/12/2022, 4:23 PM

## 2022-08-12 NOTE — Progress Notes (Deleted)
This is a Psychologist, occupational Note.  The care of the patient was discussed with Dr. Marland Kitchen and the assessment and plan was formulated with their assistance.  Please see their note for official documentation of the patient encounter.   Subjective:   Patient ID: Crystal [name] female   DOB: October 31, 1970 52 y.o.   MRN: [id number]  HPI: [name] is a 52 y.o.   Has residual cough and phlegm since visit at hospital on 08/03/22 Describes phlegm as yellow and has some associated chest pain Used inhaler last 2 days ago Used antibiotics and helped with symptoms  States does not take any regular meds right now and does not have history of past regular medicines  No fever No chills No sinus or facial pressure No headache  COVID test negative at hospital  Does not have PMH of asthma or COPD Works in a factory  [place] Has one cup of coffee in mornings No history of alcohol, smoking, drugs    For the details of today's visit, please refer to the assessment and plan.     Past Medical History:  Diagnosis Date   CIN I (cervical intraepithelial neoplasia I)    Pap 02/2019: ASCUS w/ high risk HPV   Depression    Elevated LDL cholesterol level    Obesity (BMI 30-39.9)    Post-menopausal bleeding    Pre-diabetes    Current Outpatient Medications  Medication Sig Dispense Refill   acetaminophen (TYLENOL) 325 MG tablet Take by mouth. (Patient not taking: Reported on 07/23/2022)     Azelastine HCl 137 MCG/SPRAY SOLN rociar ONE rociada IN EACH NOSTRIL TWICE DAILY AS DIRECTED     azithromycin (ZITHROMAX) 250 MG tablet Take 1 tablet (250 mg total) by mouth daily. Take first 2 tablets together, then 1 every day until finished. 6 tablet 0   benzonatate (TESSALON PERLES) 100 MG capsule Take 1 capsule (100 mg total) by mouth every 6 (six) hours as needed for cough. 30 capsule 0   diphenhydrAMINE HCl (THERAFLU MULTI SYMPTOM PO) Take by mouth.     ibuprofen (ADVIL) 200 MG tablet Take  200 mg by mouth every 6 (six) hours as needed.     No current facility-administered medications for this visit.   Family History  Problem Relation Age of Onset   Hypertension Mother    Diabetes Mother    Heart disease Father    Diabetes Father    Diabetes Sister    Diabetes Sister    Cancer Maternal Aunt        uterine   Diabetes Brother    Anesthesia problems Neg Hx    Breast cancer Neg Hx    Social History   Socioeconomic History   Marital status: Married    Spouse name: Not on file   Number of children: 5   Years of education: Not on file   Highest education level: 6th grade  Occupational History   Not on file  Tobacco Use   Smoking status: Never   Smokeless tobacco: Never  Vaping Use   Vaping status: Never Used  Substance and Sexual Activity   Alcohol use: Not Currently   Drug use: Never   Sexual activity: Yes    Birth control/protection: Surgical, Post-menopausal  Other Topics Concern   Not on file  Social History Narrative   ** Merged History Encounter **       Social Determinants of Health   Financial Resource Strain: Low Risk  (02/17/2022)   Received  from [facility], [facility]   Overall Financial Resource Strain (CARDIA)    Difficulty of Paying Living Expenses: Not very hard  Food Insecurity: No Food Insecurity (06/12/2022)   Hunger Vital Sign    Worried About Running Out of Food in the Last Year: Never true    Ran Out of Food in the Last Year: Never true  Transportation Needs: No Transportation Needs (06/12/2022)   PRAPARE - Administrator, Civil Service (Medical): No    Lack of Transportation (Non-Medical): No  Physical Activity: Sufficiently Active (07/18/2020)   Received from [facility], [facility]   Exercise Vital Sign    Days of Exercise per Week: 3 days    Minutes of Exercise per Session: 60 min  Stress: No Stress Concern Present (07/18/2020)   [facility]  Occupational Health - Occupational Stress Questionnaire    Feeling of Stress : Not at all  Social Connections: Unknown (05/30/2021)   Received from [facility], [facility]   Social Network    Social Network: Not on file   Review of Systems: {Review Of Systems:30496} Objective:  Physical Exam: Vitals:   08/12/22 1536  BP: 119/79  Temp: 98.5 F (36.9 C)  TempSrc: Oral  SpO2: (!) 0%  Weight: 200 lb 11.2 oz (91 kg)  Height: 5\' 4"  (1.626 m)    Constitutional: NAD, appears comfortable HEENT: Atraumatic, normocephalic. PERRL, anicteric sclera.  Neck: Supple, trachea midline.  Cardiovascular: RRR, no murmurs, rubs, or gallops.  Pulmonary/Chest: CTAB, no wheezes, rales, or rhonchi. No chest wall abnormalities.  Abdominal: Soft, non tender, non distended. +BS.  GU: ***  Extremities: Warm and well perfused. Distal pulses intact. No edema.  Neurological: A&Ox3, CN II - XII grossly intact.  Skin: No rashes or erythema  Psychiatric: Normal mood and affect  Assessment & Plan:   No problem-specific Assessment & Plan notes found for this encounter.

## 2022-08-13 NOTE — Addendum Note (Signed)
Addended by: Marrianne Mood on: 08/13/2022 08:50 AM   Modules accepted: Level of Service

## 2022-08-13 NOTE — Progress Notes (Signed)
Internal Medicine Attending:  I was physically present during the critical or key portions of the resident provided service and participated in formulating the plan of care and medical decision making for the patient as documented in the resident's note.  Tyson Alias, MD

## 2022-09-05 ENCOUNTER — Ambulatory Visit
Admission: EM | Admit: 2022-09-05 | Discharge: 2022-09-05 | Disposition: A | Discharge status: Home / Self Care | Payer: Managed Care, Other (non HMO) | Attending: Physician Assistant | Admitting: Physician Assistant

## 2022-09-05 DIAGNOSIS — N3000 Acute cystitis without hematuria: Secondary | ICD-10-CM | POA: Insufficient documentation

## 2022-09-05 DIAGNOSIS — R300 Dysuria: Secondary | ICD-10-CM | POA: Insufficient documentation

## 2022-09-05 LAB — POCT URINALYSIS DIP (MANUAL ENTRY)
Bilirubin, UA: NEGATIVE
Glucose, UA: NEGATIVE mg/dL
Ketones, POC UA: NEGATIVE mg/dL
Nitrite, UA: NEGATIVE
Protein Ur, POC: NEGATIVE mg/dL
Spec Grav, UA: <=1.005 — AB (ref 1.010–1.025)
Urobilinogen, UA: 0.2 E.U./dL
pH, UA: 5.5 (ref 5.0–8.0)

## 2022-09-05 MED ORDER — NITROFURANTOIN MONOHYD MACRO 100 MG PO CAPS: 100.0000 mg | ORAL_CAPSULE | Freq: Two times a day (BID) | ORAL | 0 refills | Status: DC

## 2022-09-05 NOTE — ED Triage Notes (Signed)
Pt present urinary frequency with burning sensation after urinating. Symptoms started two days ago.

## 2022-09-05 NOTE — ED Provider Notes (Signed)
[name]    CSN: [id number] Arrival date & time: 09/05/22  1611      History   Chief Complaint Chief Complaint  Patient presents with   Urinary Frequency    HPI Crystal [name] is a 52 y.o. female.   Patient here today for evaluation of urinary frequency and burning that started 2 days. She has not had any fever. She denies abdominal pain, nausea, vomiting or back pain. She has taken tylenol with mild relief.  The history is provided by the patient. The history is limited by a language barrier. A language interpreter was used Leretha [phone]).    Past Medical History:  Diagnosis Date   Acute cystitis without hematuria 05/07/2020   CIN I (cervical intraepithelial neoplasia I)    Pap 02/2019: ASCUS w/ high risk HPV   Depression    Elevated LDL cholesterol level    Obesity (BMI 30-39.9)    Post-menopausal bleeding    Postmenopausal bleeding 03/17/2020   03/13/2020 - EMB with benign pathology and a polyp     Pre-diabetes     Patient Active Problem List   Diagnosis Date Noted   Acute viral bronchitis 08/12/2022   Peripheral neuropathy 08/12/2021   Pre-diabetes 05/07/2020   Elevated LDL cholesterol level 05/07/2020   Obesity (BMI 30.0-34.9) 05/07/2020   MDD (recurrent major depressive disorder) in remission (HCC) 05/07/2020   Healthcare maintenance 05/07/2020   ASCUS with positive high risk HPV cervical 03/17/2020    Past Surgical History:  Procedure Laterality Date   CHOLECYSTECTOMY N/A 07/28/2012   Procedure: LAPAROSCOPIC CHOLECYSTECTOMY WITH INTRAOPERATIVE CHOLANGIOGRAM;  Surgeon: Wilmon Arms. Corliss Skains, MD;  Location: [facility] OR;  Service: General;  Laterality: N/A;   TUBAL LIGATION     7 years ago    OB History     Gravida  6   Para  4   Term  4   Preterm      AB  1   Living  5      SAB      IAB      Ectopic      Multiple  1   Live Births  5            Home Medications    Prior to Admission medications   Medication  Sig Start Date End Date Taking? Authorizing Provider  nitrofurantoin, macrocrystal-monohydrate, (MACROBID) 100 MG capsule Take 1 capsule (100 mg total) by mouth 2 (two) times daily. 09/05/22  Yes Tomi Bamberger, PA-C  Azelastine HCl 137 MCG/SPRAY SOLN rociar ONE rociada IN EACH NOSTRIL TWICE DAILY AS DIRECTED    [provider]  guaifenesin (ROBITUSSIN) 100 MG/5ML syrup Take 200 mg by mouth 3 (three) times daily as needed for cough.    [provider]    Family History Family History  Problem Relation Age of Onset   Hypertension Mother    Diabetes Mother    Heart disease Father    Diabetes Father    Diabetes Sister    Diabetes Sister    Cancer Maternal Aunt        uterine   Diabetes Brother    Anesthesia problems Neg Hx    Breast cancer Neg Hx     Social History Social History   Tobacco Use   Smoking status: Never   Smokeless tobacco: Never  Vaping Use   Vaping status: Never Used  Substance Use Topics   Alcohol use: Not Currently   Drug use: Never  Allergies   Patient has no known allergies.   Review of Systems Review of Systems  Constitutional:  Negative for chills and fever.  Eyes:  Negative for discharge and redness.  Respiratory:  Negative for shortness of breath.   Cardiovascular:  Negative for chest pain.  Gastrointestinal:  Negative for abdominal pain, nausea and vomiting.  Genitourinary:  Positive for dysuria and frequency.  Musculoskeletal:  Negative for back pain.     Physical Exam Triage Vital Signs ED Triage Vitals  Encounter Vitals Group     BP      Systolic BP Percentile      Diastolic BP Percentile      Pulse      Resp      Temp      Temp src      SpO2      Weight      Height      Head Circumference      Peak Flow      Pain Score      Pain Loc      Pain Education      Exclude from Growth Chart    No data found.  Updated Vital Signs BP 118/76 (BP Location: Left Arm)   Pulse 66   Temp 98.1 F (36.7 C)  (Oral)   Resp 18   LMP 12/10/2019 (Exact Date)   SpO2 98%       Physical Exam Vitals and nursing note reviewed.  Constitutional:      General: She is not in acute distress.    Appearance: Normal appearance. She is not ill-appearing.  HENT:     Head: Normocephalic and atraumatic.  Eyes:     Conjunctiva/sclera: Conjunctivae normal.  Cardiovascular:     Rate and Rhythm: Normal rate.  Pulmonary:     Effort: Pulmonary effort is normal. No respiratory distress.  Neurological:     Mental Status: She is alert.  Psychiatric:        Mood and Affect: Mood normal.        Behavior: Behavior normal.        Thought Content: Thought content normal.      UC Treatments / Results  Labs (all labs ordered are listed, but only abnormal results are displayed) Labs Reviewed  POCT URINALYSIS DIP (MANUAL ENTRY) - Abnormal; Notable for the following components:      Result Value   Spec Grav, UA <=1.005 (*)    Blood, UA trace-intact (*)    Leukocytes, UA Small (1+) (*)    All other components within normal limits  URINE CULTURE    EKG   Radiology No results found.  Procedures Procedures (including critical care time)  Medications Ordered in UC Medications - No data to display  Initial Impression / Assessment and Plan / UC Course  I have reviewed the triage vital signs and the nursing notes.  Pertinent labs & imaging results that were available during my care of the patient were reviewed by me and considered in my medical decision making (see chart for details).    Macrobid prescribed for UTI coverage and urine culture ordered. Encouraged follow up if no gradual improvement or with any further concerns.   Final Clinical Impressions(s) / UC Diagnoses   Final diagnoses:  Dysuria  Acute cystitis without hematuria   Discharge Instructions   None    ED Prescriptions     Medication Sig Dispense Auth. Provider   nitrofurantoin, macrocrystal-monohydrate, (MACROBID) 100 MG  capsule Take  1 capsule (100 mg total) by mouth 2 (two) times daily. 10 capsule Tomi Bamberger, PA-C      PDMP not reviewed this encounter.   Tomi Bamberger, PA-C 09/05/22 [phone]

## 2022-10-22 ENCOUNTER — Ambulatory Visit
Admission: EM | Admit: 2022-10-22 | Discharge: 2022-10-22 | Disposition: A | Discharge status: Home / Self Care | Payer: Managed Care, Other (non HMO) | Attending: Internal Medicine | Admitting: Internal Medicine

## 2022-10-22 ENCOUNTER — Encounter: Payer: Self-pay | Admitting: *Deleted

## 2022-10-22 ENCOUNTER — Other Ambulatory Visit: Payer: Self-pay

## 2022-10-22 DIAGNOSIS — R300 Dysuria: Secondary | ICD-10-CM | POA: Insufficient documentation

## 2022-10-22 DIAGNOSIS — N3001 Acute cystitis with hematuria: Secondary | ICD-10-CM | POA: Insufficient documentation

## 2022-10-22 DIAGNOSIS — Z113 Encounter for screening for infections with a predominantly sexual mode of transmission: Secondary | ICD-10-CM | POA: Diagnosis present

## 2022-10-22 LAB — POCT URINALYSIS DIP (MANUAL ENTRY)
Bilirubin, UA: NEGATIVE
Glucose, UA: NEGATIVE mg/dL
Ketones, POC UA: NEGATIVE mg/dL
Nitrite, UA: NEGATIVE
Protein Ur, POC: 30 mg/dL — AB
Spec Grav, UA: 1.01 (ref 1.010–1.025)
Urobilinogen, UA: 0.2 E.U./dL
pH, UA: 6.5 (ref 5.0–8.0)

## 2022-10-22 MED ORDER — SULFAMETHOXAZOLE-TRIMETHOPRIM 800-160 MG PO TABS: 1.0000 | ORAL_TABLET | Freq: Two times a day (BID) | ORAL | 0 refills | Status: AC

## 2022-10-22 NOTE — ED Triage Notes (Signed)
Pt reports dysuria and frequency today

## 2022-10-22 NOTE — ED Provider Notes (Signed)
[name]    CSN: [id number] Arrival date & time: 10/22/22  1245      History   Chief Complaint Chief Complaint  Patient presents with   Dysuria    HPI Crystal [name] is a 52 y.o. female.   Patient presents with urinary burning and frequency that started this morning.  Reports that she has frequent urinary tract infections with last 1 being approximately 1 to 2 months ago.  Denies vaginal discharge, hematuria, abdominal pain, fever but does report some lower back pain which typically occurs when she gets urinary tract infections.  Denies confirmed exposure to STD but patient would like STD testing today.  Patient reports she no longer has menstrual cycles as she is in menopause.   Dysuria   Past Medical History:  Diagnosis Date   Acute cystitis without hematuria 05/07/2020   CIN I (cervical intraepithelial neoplasia I)    Pap 02/2019: ASCUS w/ high risk HPV   Depression    Elevated LDL cholesterol level    Obesity (BMI 30-39.9)    Post-menopausal bleeding    Postmenopausal bleeding 03/17/2020   03/13/2020 - EMB with benign pathology and a polyp     Pre-diabetes     Patient Active Problem List   Diagnosis Date Noted   Acute viral bronchitis 08/12/2022   Peripheral neuropathy 08/12/2021   Pre-diabetes 05/07/2020   Elevated LDL cholesterol level 05/07/2020   Obesity (BMI 30.0-34.9) 05/07/2020   MDD (recurrent major depressive disorder) in remission (HCC) 05/07/2020   Healthcare maintenance 05/07/2020   ASCUS with positive high risk HPV cervical 03/17/2020    Past Surgical History:  Procedure Laterality Date   CHOLECYSTECTOMY N/A 07/28/2012   Procedure: LAPAROSCOPIC CHOLECYSTECTOMY WITH INTRAOPERATIVE CHOLANGIOGRAM;  Surgeon: Wilmon Arms. Corliss Skains, MD;  Location: [facility] OR;  Service: General;  Laterality: N/A;   TUBAL LIGATION     7 years ago    OB History     Gravida  6   Para  4   Term  4   Preterm      AB  1   Living  5      SAB       IAB      Ectopic      Multiple  1   Live Births  5            Home Medications    Prior to Admission medications   Medication Sig Start Date End Date Taking? Authorizing Provider  sulfamethoxazole-trimethoprim (BACTRIM DS) 800-160 MG tablet Take 1 tablet by mouth 2 (two) times daily for 3 days. 10/22/22 10/25/22 Yes Bharath Bernstein, Acie Fredrickson, FNP  Azelastine HCl 137 MCG/SPRAY SOLN rociar ONE rociada IN EACH NOSTRIL TWICE DAILY AS DIRECTED    [provider]  guaifenesin (ROBITUSSIN) 100 MG/5ML syrup Take 200 mg by mouth 3 (three) times daily as needed for cough.    [provider]  nitrofurantoin, macrocrystal-monohydrate, (MACROBID) 100 MG capsule Take 1 capsule (100 mg total) by mouth 2 (two) times daily. 09/05/22   Tomi Bamberger, PA-C    Family History Family History  Problem Relation Age of Onset   Hypertension Mother    Diabetes Mother    Heart disease Father    Diabetes Father    Diabetes Sister    Diabetes Sister    Cancer Maternal Aunt        uterine   Diabetes Brother    Anesthesia problems Neg Hx    Breast cancer Neg Hx  Social History Social History   Tobacco Use   Smoking status: Never   Smokeless tobacco: Never  Vaping Use   Vaping status: Never Used  Substance Use Topics   Alcohol use: Not Currently   Drug use: Never     Allergies   Patient has no known allergies.   Review of Systems Review of Systems Per HPI  Physical Exam Triage Vital Signs ED Triage Vitals [10/22/22 1453]  Encounter Vitals Group     BP 134/85     Systolic BP Percentile      Diastolic BP Percentile      Pulse Rate 70     Resp 16     Temp 98 F (36.7 C)     Temp Source Oral     SpO2 97 %     Weight      Height      Head Circumference      Peak Flow      Pain Score 8     Pain Loc      Pain Education      Exclude from Growth Chart    No data found.  Updated Vital Signs BP 134/85 (BP Location: Right Arm)   Pulse 70   Temp 98 F (36.7 C)  (Oral)   Resp 16   LMP 12/10/2019 (Exact Date)   SpO2 97%   Visual Acuity Right Eye Distance:   Left Eye Distance:   Bilateral Distance:    Right Eye Near:   Left Eye Near:    Bilateral Near:     Physical Exam Constitutional:      General: She is not in acute distress.    Appearance: Normal appearance. She is not toxic-appearing or diaphoretic.  HENT:     Head: Normocephalic and atraumatic.  Eyes:     Extraocular Movements: Extraocular movements intact.     Conjunctiva/sclera: Conjunctivae normal.  Pulmonary:     Effort: Pulmonary effort is normal.  Genitourinary:    Comments: Deferred with shared decision making.  Self swab performed. Neurological:     General: No focal deficit present.     Mental Status: She is alert and oriented to person, place, and time. Mental status is at baseline.  Psychiatric:        Mood and Affect: Mood normal.        Behavior: Behavior normal.        Thought Content: Thought content normal.        Judgment: Judgment normal.      UC Treatments / Results  Labs (all labs ordered are listed, but only abnormal results are displayed) Labs Reviewed  POCT URINALYSIS DIP (MANUAL ENTRY) - Abnormal; Notable for the following components:      Result Value   Clarity, UA cloudy (*)    Blood, UA large (*)    Protein Ur, POC =30 (*)    Leukocytes, UA Small (1+) (*)    All other components within normal limits  URINE CULTURE  HIV ANTIBODY (ROUTINE TESTING W REFLEX)  RPR  CERVICOVAGINAL ANCILLARY ONLY    EKG   Radiology No results found.  Procedures Procedures (including critical care time)  Medications Ordered in UC Medications - No data to display  Initial Impression / Assessment and Plan / UC Course  I have reviewed the triage vital signs and the nursing notes.  Pertinent labs & imaging results that were available during my care of the patient were reviewed by me and considered in my  medical decision making (see chart for  details).     UA indicating urinary tract infection with small amount of leukocytes so will treat with Bactrim.  Urine culture pending.  Patient requesting vaginal swab, HIV, RPR so this is pending.  Advised follow-up with urgent care or urogynecology at provided contact for further evaluation and management if symptoms persist or worsen.  Patient verbalized understanding and was agreeable with plan.  Interpreter used throughout patient interaction. Final Clinical Impressions(s) / UC Diagnoses   Final diagnoses:  Acute cystitis with hematuria  Dysuria  Screening examination for venereal disease     Discharge Instructions      I have sent you an antibiotic to treat urinary tract infection.  Urine culture, vaginal swab, STD testing is pending.  Will call if it is abnormal.  Recommend that you follow-up with urologist as well given frequent urinary tract infections.    ED Prescriptions     Medication Sig Dispense Auth. Provider   sulfamethoxazole-trimethoprim (BACTRIM DS) 800-160 MG tablet Take 1 tablet by mouth 2 (two) times daily for 3 days. 6 tablet [place], Acie Fredrickson, [place]      PDMP not reviewed this encounter.   Gustavus Bryant, [place] 10/22/22 1539

## 2022-10-22 NOTE — Discharge Instructions (Addendum)
I have sent you an antibiotic to treat urinary tract infection.  Urine culture, vaginal swab, STD testing is pending.  Will call if it is abnormal.  Recommend that you follow-up with urologist as well given frequent urinary tract infections.

## 2022-10-23 LAB — HIV ANTIBODY (ROUTINE TESTING W REFLEX): HIV Screen 4th Generation wRfx: NONREACTIVE

## 2022-10-23 LAB — RPR: RPR Ser Ql: NONREACTIVE

## 2022-10-23 LAB — CERVICOVAGINAL ANCILLARY ONLY
Bacterial Vaginitis (gardnerella): NEGATIVE
Candida Glabrata: NEGATIVE
Candida Vaginitis: NEGATIVE
Chlamydia: NEGATIVE
Comment: NEGATIVE
Comment: NEGATIVE
Comment: NEGATIVE
Comment: NEGATIVE
Comment: NEGATIVE
Comment: NORMAL
Neisseria Gonorrhea: NEGATIVE
Trichomonas: NEGATIVE

## 2022-10-24 LAB — URINE CULTURE: Culture: NO GROWTH

## 2023-01-23 ENCOUNTER — Other Ambulatory Visit: Payer: Self-pay

## 2023-01-23 ENCOUNTER — Ambulatory Visit
Admission: EM | Admit: 2023-01-23 | Discharge: 2023-01-23 | Disposition: A | Discharge status: Home / Self Care | Payer: Managed Care, Other (non HMO) | Attending: Physician Assistant | Admitting: Physician Assistant

## 2023-01-23 ENCOUNTER — Encounter: Payer: Self-pay | Admitting: *Deleted

## 2023-01-23 DIAGNOSIS — R300 Dysuria: Secondary | ICD-10-CM | POA: Diagnosis present

## 2023-01-23 DIAGNOSIS — N390 Urinary tract infection, site not specified: Secondary | ICD-10-CM | POA: Diagnosis present

## 2023-01-23 LAB — POCT URINALYSIS DIP (MANUAL ENTRY)
Bilirubin, UA: NEGATIVE
Glucose, UA: NEGATIVE mg/dL
Ketones, POC UA: NEGATIVE mg/dL
Nitrite, UA: NEGATIVE
Protein Ur, POC: NEGATIVE mg/dL
Spec Grav, UA: 1.015 (ref 1.010–1.025)
Urobilinogen, UA: 0.2 U/dL
pH, UA: 7 (ref 5.0–8.0)

## 2023-01-23 MED ORDER — PHENAZOPYRIDINE HCL 200 MG PO TABS: 200.0000 mg | ORAL_TABLET | Freq: Three times a day (TID) | ORAL | 0 refills | Status: AC

## 2023-01-23 MED ORDER — NITROFURANTOIN MONOHYD MACRO 100 MG PO CAPS: 100.0000 mg | ORAL_CAPSULE | Freq: Two times a day (BID) | ORAL | 0 refills | Status: DC

## 2023-01-23 NOTE — ED Provider Notes (Signed)
[name]    CSN: [id number] Arrival date & time: 01/23/23  1111      History   Chief Complaint Chief Complaint  Patient presents with   Dysuria    HPI Krist Rufo is a 52 y.o. female.   Patient presents today with a 2-day history of UTI symptoms.  She is Spanish-speaking and video interpreter utilized during visit.  She reports dysuria, urinary frequency, lower abdominal pain.  Denies any hematuria, fever, nausea, vomiting, vaginal symptoms.  Denies any recent urogenital procedure, self-catheterization, single kidney, history of nephrolithiasis.  She has not tried any over-the-counter medication for symptom management.  She denies history of diabetes does not take SGLT2 inhibitor.  She does have a history of urinary tract infections was last treated in August 2024.  Denies additional antibiotics in the past 90 days.  She reports lower abdominal discomfort is rated 8 on a 0-10 pain scale, described as pressure, worse with micturition, no alleviating factors identified.  She is postmenopausal and has no concern for pregnancy.    Past Medical History:  Diagnosis Date   Acute cystitis without hematuria 05/07/2020   CIN I (cervical intraepithelial neoplasia I)    Pap 02/2019: ASCUS w/ high risk HPV   Depression    Elevated LDL cholesterol level    Obesity (BMI 30-39.9)    Post-menopausal bleeding    Postmenopausal bleeding 03/17/2020   03/13/2020 - EMB with benign pathology and a polyp     Pre-diabetes     Patient Active Problem List   Diagnosis Date Noted   Acute viral bronchitis 08/12/2022   Peripheral neuropathy 08/12/2021   Pre-diabetes 05/07/2020   Elevated LDL cholesterol level 05/07/2020   Obesity (BMI 30.0-34.9) 05/07/2020   MDD (recurrent major depressive disorder) in remission (HCC) 05/07/2020   Healthcare maintenance 05/07/2020   ASCUS with positive high risk HPV cervical 03/17/2020    Past Surgical History:  Procedure Laterality Date    CHOLECYSTECTOMY N/A 07/28/2012   Procedure: LAPAROSCOPIC CHOLECYSTECTOMY WITH INTRAOPERATIVE CHOLANGIOGRAM;  Surgeon: Wilmon Arms. Corliss Skains, MD;  Location: [facility] OR;  Service: General;  Laterality: N/A;   TUBAL LIGATION     7 years ago    OB History     Gravida  6   Para  4   Term  4   Preterm      AB  1   Living  5      SAB      IAB      Ectopic      Multiple  1   Live Births  5            Home Medications    Prior to Admission medications   Medication Sig Start Date End Date Taking? Authorizing Provider  phenazopyridine (PYRIDIUM) 200 MG tablet Take 1 tablet (200 mg total) by mouth 3 (three) times daily. 01/23/23  Yes Banesa Tristan K, PA-C  Azelastine HCl 137 MCG/SPRAY SOLN rociar ONE rociada IN EACH NOSTRIL TWICE DAILY AS DIRECTED Patient not taking: Reported on 01/23/2023    [provider]  guaifenesin (ROBITUSSIN) 100 MG/5ML syrup Take 200 mg by mouth 3 (three) times daily as needed for cough. Patient not taking: Reported on 01/23/2023    [provider]  nitrofurantoin, macrocrystal-monohydrate, (MACROBID) 100 MG capsule Take 1 capsule (100 mg total) by mouth 2 (two) times daily. 01/23/23   Raziah Funnell, Noberto Retort, PA-C    Family History Family History  Problem Relation Age of Onset   Hypertension  Mother    Diabetes Mother    Heart disease Father    Diabetes Father    Diabetes Sister    Diabetes Sister    Cancer Maternal Aunt        uterine   Diabetes Brother    Anesthesia problems Neg Hx    Breast cancer Neg Hx     Social History Social History   Tobacco Use   Smoking status: Never   Smokeless tobacco: Never  Vaping Use   Vaping status: Never Used  Substance Use Topics   Alcohol use: Not Currently   Drug use: Never     Allergies   Patient has no known allergies.   Review of Systems Review of Systems  Constitutional:  Positive for activity change. Negative for appetite change, fatigue and fever.  Gastrointestinal:   Negative for abdominal pain, diarrhea, nausea and vomiting.  Genitourinary:  Positive for dysuria and urgency. Negative for flank pain, frequency, genital sores, hematuria, vaginal bleeding, vaginal discharge and vaginal pain.     Physical Exam Triage Vital Signs ED Triage Vitals  Encounter Vitals Group     BP 01/23/23 1327 116/78     Systolic BP Percentile --      Diastolic BP Percentile --      Pulse Rate 01/23/23 1327 70     Resp 01/23/23 1327 18     Temp 01/23/23 1327 97.7 F (36.5 C)     Temp Source 01/23/23 1327 Oral     SpO2 01/23/23 1327 99 %     Weight --      Height --      Head Circumference --      Peak Flow --      Pain Score 01/23/23 1321 8     Pain Loc --      Pain Education --      Exclude from Growth Chart --    No data found.  Updated Vital Signs BP 116/78 (BP Location: Left Arm)   Pulse 70   Temp 97.7 F (36.5 C) (Oral)   Resp 18   LMP 12/10/2019 (Exact Date)   SpO2 99%   Visual Acuity Right Eye Distance:   Left Eye Distance:   Bilateral Distance:    Right Eye Near:   Left Eye Near:    Bilateral Near:     Physical Exam Vitals reviewed.  Constitutional:      General: She is awake. She is not in acute distress.    Appearance: Normal appearance. She is well-developed. She is not ill-appearing.     Comments: Very pleasant female appears stated age in no acute distress sitting comfortably in exam room  HENT:     Head: Normocephalic and atraumatic.  Cardiovascular:     Rate and Rhythm: Normal rate and regular rhythm.     Heart sounds: Normal heart sounds, S1 normal and S2 normal. No murmur heard. Pulmonary:     Effort: Pulmonary effort is normal.     Breath sounds: Normal breath sounds. No wheezing, rhonchi or rales.     Comments: Clear to auscultate bilaterally Abdominal:     General: Bowel sounds are normal.     Palpations: Abdomen is soft.     Tenderness: There is no abdominal tenderness. There is no right CVA tenderness, left CVA  tenderness, guarding or rebound.     Comments: Benign abdominal exam  Psychiatric:        Behavior: Behavior is cooperative.      UC  Treatments / Results  Labs (all labs ordered are listed, but only abnormal results are displayed) Labs Reviewed  POCT URINALYSIS DIP (MANUAL ENTRY) - Abnormal; Notable for the following components:      Result Value   Clarity, UA hazy (*)    Blood, UA trace-intact (*)    Leukocytes, UA Small (1+) (*)    All other components within normal limits  URINE CULTURE    EKG   Radiology No results found.  Procedures Procedures (including critical care time)  Medications Ordered in UC Medications - No data to display  Initial Impression / Assessment and Plan / UC Course  I have reviewed the triage vital signs and the nursing notes.  Pertinent labs & imaging results that were available during my care of the patient were reviewed by me and considered in my medical decision making (see chart for details).     Patient is well-appearing, afebrile, nontoxic, nontachycardic.  Vital signs and physical exam are reassuring with no indication for emergent evaluation or imaging.  UA consistent with UTI.  Will start Macrobid twice daily for 5 days.  She does report significant discomfort so was started on Pyridium for symptom relief.  She was encouraged to rest and drink plenty of fluid.  Urine culture was obtained and we will contact her if we need to stop or change her antibiotics based on culture results.  Discussed that if her symptoms are not improving within a few days or if anything worsens she needs to be seen immediately including gross hematuria, abdominal pain, fever, nausea, vomiting.  Recommended she follow-up with her primary care for repeat urine in a few weeks to ensure that blood noted today resolves with clearing of infection.  Strict return precautions given.  All questions answered to patient satisfaction.  Final Clinical Impressions(s) / UC  Diagnoses   Final diagnoses:  Acute UTI  Dysuria     Discharge Instructions      We are treating you for urinary tract infection.  Start Macrobid twice daily for 5 days.  Take Pyridium up to 3 times a day to help with your urinary pain symptoms.  Make sure you are drinking plenty of fluid.  We will contact you if we need to change or stop your antibiotic based on your culture results.  If your symptoms are not improving within a few days or if anything worsens and you have severe abdominal pain, nausea, vomiting, blood in your urine, weakness you need to be seen immediately.  Lo estamos tratando por infeccin del tracto urinario.  Inicie [employer] al da durante [place].  Tome Pyridium hasta 3 veces al da para ayudar con los sntomas del dolor urinario.  Asegrese de beber mucho lquido.  Nos comunicaremos con usted si necesitamos cambiar o suspender su antibitico segn los [address] de su cultivo.  Si sus sntomas no mejoran en unos pocos das o si algo empeora y tiene dolor abdominal intenso, nuseas, vmitos, sangre en la orina o debilidad, debe ser atendido de inmediato.     ED Prescriptions     Medication Sig Dispense Auth. Provider   nitrofurantoin, macrocrystal-monohydrate, (MACROBID) 100 MG capsule Take 1 capsule (100 mg total) by mouth 2 (two) times daily. 10 capsule Meryle Pugmire K, PA-C   phenazopyridine (PYRIDIUM) 200 MG tablet Take 1 tablet (200 mg total) by mouth 3 (three) times daily. 6 tablet Jerilynn Feldmeier, Noberto Retort, PA-C      PDMP not reviewed this encounter.   Jacari Kirsten,  Noberto Retort, PA-C 01/23/23 1426

## 2023-01-23 NOTE — Discharge Instructions (Signed)
We are treating you for urinary tract infection.  Start Macrobid twice daily for 5 days.  Take Pyridium up to 3 times a day to help with your urinary pain symptoms.  Make sure you are drinking plenty of fluid.  We will contact you if we need to change or stop your antibiotic based on your culture results.  If your symptoms are not improving within a few days or if anything worsens and you have severe abdominal pain, nausea, vomiting, blood in your urine, weakness you need to be seen immediately.  Lo estamos tratando por infeccin del tracto urinario.  Inicie [employer] al da durante [address] [place].  Tome Pyridium hasta 3 veces al da para ayudar con los sntomas del dolor urinario.  Asegrese de beber mucho lquido.  Nos comunicaremos con usted si necesitamos cambiar o suspender su antibitico segn los [address] de su cultivo.  Si sus sntomas no mejoran en unos pocos das o si algo empeora y tiene dolor abdominal intenso, nuseas, vmitos, sangre en la orina o debilidad, debe ser atendido de inmediato.

## 2023-01-23 NOTE — ED Triage Notes (Signed)
C/o dysuria x 2 days with burning. Triage with Domingo Cocking #[id number]

## 2023-01-25 LAB — URINE CULTURE

## 2023-02-20 ENCOUNTER — Other Ambulatory Visit (HOSPITAL_BASED_OUTPATIENT_CLINIC_OR_DEPARTMENT_OTHER): Payer: Self-pay

## 2023-03-13 ENCOUNTER — Ambulatory Visit
Admission: EM | Admit: 2023-03-13 | Discharge: 2023-03-13 | Disposition: A | Discharge status: Home / Self Care | Payer: Managed Care, Other (non HMO)

## 2023-03-13 DIAGNOSIS — J101 Influenza due to other identified influenza virus with other respiratory manifestations: Secondary | ICD-10-CM | POA: Diagnosis not present

## 2023-03-13 DIAGNOSIS — E7800 Pure hypercholesterolemia, unspecified: Secondary | ICD-10-CM | POA: Insufficient documentation

## 2023-03-13 LAB — POC COVID19/FLU A&B COMBO
Covid Antigen, POC: NEGATIVE
Influenza A Antigen, POC: POSITIVE — AB
Influenza B Antigen, POC: NEGATIVE

## 2023-03-13 MED ORDER — OSELTAMIVIR PHOSPHATE 75 MG PO CAPS: 75.0000 mg | ORAL_CAPSULE | Freq: Two times a day (BID) | ORAL | 0 refills | Status: DC

## 2023-03-13 NOTE — ED Triage Notes (Signed)
Due to language barrier, an interpreter was present during the history-taking and subsequent discussion (and for part of the physical exam) with this patient. (Note: Interrupted/slow connection in beginning of call), Crystal [name]. Number: [id number].  Here with Spouse. "Started with Cough on Wednesday & last night Fever started". "I have had the Flu and COVID19 vaccine. "Also of runny nose and congestion".

## 2023-03-13 NOTE — ED Provider Notes (Signed)
[name]    CSN: [id number] Arrival date & time: 03/13/23  1329      History   Chief Complaint Chief Complaint  Patient presents with   Cough   Fever    HPI Crystal [name] is a 53 y.o. female.   Patient here today for evaluation of cough and congestion that started a few nights ago.  She reports that fever started last night.  She has had some sore throat but denies any ear pain.  She has taken over-the-counter medication with mild relief.  The history is provided by the patient. The history is limited by a language barrier. A language interpreter was used.  Cough Associated symptoms: chills, fever and sore throat   Associated symptoms: no ear pain, no eye discharge, no shortness of breath and no wheezing   Fever Associated symptoms: chills, congestion, cough and sore throat   Associated symptoms: no diarrhea, no ear pain, no nausea and no vomiting     Past Medical History:  Diagnosis Date   Acute cystitis without hematuria 05/07/2020   CIN I (cervical intraepithelial neoplasia I)    Pap 02/2019: ASCUS w/ high risk HPV   Depression    Elevated LDL cholesterol level    Obesity (BMI 30-39.9)    Post-menopausal bleeding    Postmenopausal bleeding 03/17/2020   03/13/2020 - EMB with benign pathology and a polyp     Pre-diabetes     Patient Active Problem List   Diagnosis Date Noted   Hypercholesterolemia 03/13/2023   Acute viral bronchitis 08/12/2022   Peripheral neuropathy 08/12/2021   Pre-diabetes 05/07/2020   Elevated LDL cholesterol level 05/07/2020   Obesity (BMI 30.0-34.9) 05/07/2020   MDD (recurrent major depressive disorder) in remission (HCC) 05/07/2020   Healthcare maintenance 05/07/2020   ASCUS with positive high risk HPV cervical 03/17/2020    Past Surgical History:  Procedure Laterality Date   CHOLECYSTECTOMY N/A 07/28/2012   Procedure: LAPAROSCOPIC CHOLECYSTECTOMY WITH INTRAOPERATIVE CHOLANGIOGRAM;  Surgeon: Wilmon Arms. Corliss Skains,  MD;  Location: [facility] OR;  Service: General;  Laterality: N/A;   TUBAL LIGATION     7 years ago    OB History     Gravida  6   Para  4   Term  4   Preterm      AB  1   Living  5      SAB      IAB      Ectopic      Multiple  1   Live Births  5            Home Medications    Prior to Admission medications   Medication Sig Start Date End Date Taking? Authorizing Provider  acetaminophen (TYLENOL) 500 MG tablet Take 500 mg by mouth every 6 (six) hours as needed for fever. Last dose: 3am.   Yes [provider]  Cephalexin 500 MG tablet Take 500 mg by mouth 2 (two) times daily. 08/29/22  Yes [provider]  methylPREDNISolone (MEDROL DOSEPAK) 4 MG TBPK tablet Take 4 mg by mouth as directed. 12/17/22  Yes [provider]  oseltamivir (TAMIFLU) 75 MG capsule Take 1 capsule (75 mg total) by mouth every 12 (twelve) hours. 03/13/23  Yes Tomi Bamberger, PA-C  sulfamethoxazole-trimethoprim (BACTRIM DS) 800-160 MG tablet Take 1 tablet by mouth 2 (two) times daily. 03/05/23  Yes [provider]  Azelastine HCl 137 MCG/SPRAY SOLN rociar ONE rociada IN EACH NOSTRIL TWICE DAILY AS DIRECTED Patient  not taking: Reported on 01/23/2023    [provider]  guaifenesin (ROBITUSSIN) 100 MG/5ML syrup Take 200 mg by mouth 3 (three) times daily as needed for cough. Patient not taking: Reported on 01/23/2023    [provider]  nitrofurantoin, macrocrystal-monohydrate, (MACROBID) 100 MG capsule Take 1 capsule (100 mg total) by mouth 2 (two) times daily. 01/23/23   Raspet, Noberto Retort, PA-C  phenazopyridine (PYRIDIUM) 200 MG tablet Take 1 tablet (200 mg total) by mouth 3 (three) times daily. 01/23/23   Raspet, Noberto Retort, PA-C    Family History Family History  Problem Relation Age of Onset   Hypertension Mother    Diabetes Mother    Heart disease Father    Diabetes Father    Diabetes Sister    Diabetes Sister    Cancer Maternal Aunt         uterine   Diabetes Brother    Anesthesia problems Neg Hx    Breast cancer Neg Hx     Social History Social History   Tobacco Use   Smoking status: Never   Smokeless tobacco: Never  Vaping Use   Vaping status: Never Used  Substance Use Topics   Alcohol use: Not Currently    Comment: Occassionally.   Drug use: Never     Allergies   Patient has no known allergies.   Review of Systems Review of Systems  Constitutional:  Positive for chills and fever.  HENT:  Positive for congestion and sore throat. Negative for ear pain.   Eyes:  Negative for discharge and redness.  Respiratory:  Positive for cough. Negative for shortness of breath and wheezing.   Gastrointestinal:  Negative for abdominal pain, diarrhea, nausea and vomiting.     Physical Exam Triage Vital Signs ED Triage Vitals  Encounter Vitals Group     BP 03/13/23 1413 107/74     Systolic BP Percentile --      Diastolic BP Percentile --      Pulse Rate 03/13/23 1413 86     Resp 03/13/23 1413 18     Temp 03/13/23 1413 98.1 F (36.7 C)     Temp Source 03/13/23 1413 Oral     SpO2 03/13/23 1413 98 %     Weight 03/13/23 1408 200 lb 9.9 oz (91 kg)     Height 03/13/23 1408 5\' 4"  (1.626 m)     Head Circumference --      Peak Flow --      Pain Score 03/13/23 1403 0     Pain Loc --      Pain Education --      Exclude from Growth Chart --    No data found.  Updated Vital Signs BP 107/74 (BP Location: Left Arm)   Pulse 86   Temp 98.1 F (36.7 C) (Oral)   Resp 18   Ht 5\' 4"  (1.626 m)   Wt 200 lb 9.9 oz (91 kg)   LMP 12/10/2019 (Exact Date)   SpO2 98%   BMI 34.44 kg/m   Visual Acuity Right Eye Distance:   Left Eye Distance:   Bilateral Distance:    Right Eye Near:   Left Eye Near:    Bilateral Near:     Physical Exam Vitals and nursing note reviewed.  Constitutional:      General: She is not in acute distress.    Appearance: Normal appearance. She is not ill-appearing.  HENT:     Head:  Normocephalic and atraumatic.  Right Ear: Tympanic membrane normal.     Left Ear: Tympanic membrane normal.     Nose: Congestion present.     Mouth/Throat:     Mouth: Mucous membranes are moist.     Pharynx: No oropharyngeal exudate or posterior oropharyngeal erythema.  Eyes:     Conjunctiva/sclera: Conjunctivae normal.  Cardiovascular:     Rate and Rhythm: Normal rate and regular rhythm.     Heart sounds: Normal heart sounds. No murmur heard. Pulmonary:     Effort: Pulmonary effort is normal. No respiratory distress.     Breath sounds: Normal breath sounds. No wheezing, rhonchi or rales.  Skin:    General: Skin is warm and dry.  Neurological:     Mental Status: She is alert.  Psychiatric:        Mood and Affect: Mood normal.        Thought Content: Thought content normal.      UC Treatments / Results  Labs (all labs ordered are listed, but only abnormal results are displayed) Labs Reviewed  POC COVID19/FLU A&B COMBO - Abnormal; Notable for the following components:      Result Value   Influenza A Antigen, POC Positive (*)    All other components within normal limits    EKG   Radiology No results found.  Procedures Procedures (including critical care time)  Medications Ordered in UC Medications - No data to display  Initial Impression / Assessment and Plan / UC Course  I have reviewed the triage vital signs and the nursing notes.  Pertinent labs & imaging results that were available during my care of the patient were reviewed by me and considered in my medical decision making (see chart for details).    Flu screening positive.  Will treat with Tamiflu and advised symptomatic treatment, increase fluids and rest otherwise.  Recommended further evaluation with any worsening/persistent symptoms or other concerns.  Final Clinical Impressions(s) / UC Diagnoses   Final diagnoses:  Influenza A   Discharge Instructions   None    ED Prescriptions      Medication Sig Dispense Auth. Provider   oseltamivir (TAMIFLU) 75 MG capsule Take 1 capsule (75 mg total) by mouth every 12 (twelve) hours. 10 capsule Tomi Bamberger, PA-C      PDMP not reviewed this encounter.   Tomi Bamberger, PA-C 03/13/23 1551

## 2023-04-02 ENCOUNTER — Ambulatory Visit (INDEPENDENT_AMBULATORY_CARE_PROVIDER_SITE_OTHER)

## 2023-04-02 ENCOUNTER — Ambulatory Visit
Admission: EM | Admit: 2023-04-02 | Discharge: 2023-04-02 | Disposition: A | Discharge status: Home / Self Care | Attending: Physician Assistant | Admitting: Physician Assistant

## 2023-04-02 DIAGNOSIS — R053 Chronic cough: Secondary | ICD-10-CM | POA: Diagnosis not present

## 2023-04-02 DIAGNOSIS — J189 Pneumonia, unspecified organism: Secondary | ICD-10-CM | POA: Diagnosis not present

## 2023-04-02 MED ORDER — AMOXICILLIN-POT CLAVULANATE 875-125 MG PO TABS: 1.0000 | ORAL_TABLET | Freq: Two times a day (BID) | ORAL | 0 refills | Status: DC

## 2023-04-02 MED ORDER — AZITHROMYCIN 250 MG PO TABS: 250.0000 mg | ORAL_TABLET | Freq: Every day | ORAL | 0 refills | Status: DC

## 2023-04-02 NOTE — ED Provider Notes (Signed)
 [name]    CSN: [id number] Arrival date & time: 04/02/23  1829      History   Chief Complaint Chief Complaint  Patient presents with   Cough    HPI Crystal [name] is a 53 y.o. female.   Patient here today with husband (who assists with translation at patient request) for evaluation of fever that started about 3 weeks ago.  She has continued to have cough although fever has resolved.  She was diagnosed with flu about 3 weeks ago as well.  She denies any shortness of breath or wheezing.  The history is provided by the patient.  Cough Associated symptoms: sore throat   Associated symptoms: no chills, no ear pain, no eye discharge, no fever, no shortness of breath and no wheezing     Past Medical History:  Diagnosis Date   Acute cystitis without hematuria 05/07/2020   CIN I (cervical intraepithelial neoplasia I)    Pap 02/2019: ASCUS w/ high risk HPV   Depression    Elevated LDL cholesterol level    Obesity (BMI 30-39.9)    Post-menopausal bleeding    Postmenopausal bleeding 03/17/2020   03/13/2020 - EMB with benign pathology and a polyp     Pre-diabetes     Patient Active Problem List   Diagnosis Date Noted   Hypercholesterolemia 03/13/2023   Acute viral bronchitis 08/12/2022   Peripheral neuropathy 08/12/2021   Pre-diabetes 05/07/2020   Elevated LDL cholesterol level 05/07/2020   Obesity (BMI 30.0-34.9) 05/07/2020   MDD (recurrent major depressive disorder) in remission (HCC) 05/07/2020   Healthcare maintenance 05/07/2020   ASCUS with positive high risk HPV cervical 03/17/2020    Past Surgical History:  Procedure Laterality Date   CHOLECYSTECTOMY N/A 07/28/2012   Procedure: LAPAROSCOPIC CHOLECYSTECTOMY WITH INTRAOPERATIVE CHOLANGIOGRAM;  Surgeon: Wilmon Arms. Corliss Skains, MD;  Location: [facility] OR;  Service: General;  Laterality: N/A;   TUBAL LIGATION     7 years ago    OB History     Gravida  6   Para  4   Term  4   Preterm      AB  1    Living  5      SAB      IAB      Ectopic      Multiple  1   Live Births  5            Home Medications    Prior to Admission medications   Medication Sig Start Date End Date Taking? Authorizing Provider  amoxicillin-clavulanate (AUGMENTIN) 875-125 MG tablet Take 1 tablet by mouth every 12 (twelve) hours. 04/02/23  Yes Tomi Bamberger, PA-C  azithromycin (ZITHROMAX) 250 MG tablet Take 1 tablet (250 mg total) by mouth daily. Take first 2 tablets together, then 1 every day until finished. 04/02/23  Yes Tomi Bamberger, PA-C  guaifenesin (ROBITUSSIN) 100 MG/5ML syrup Take 200 mg by mouth 3 (three) times daily as needed for cough.   Yes [provider]  acetaminophen (TYLENOL) 500 MG tablet Take 500 mg by mouth every 6 (six) hours as needed for fever. Last dose: 3am.    [provider]  Azelastine HCl 137 MCG/SPRAY SOLN rociar ONE rociada IN EACH NOSTRIL TWICE DAILY AS DIRECTED Patient not taking: Reported on 01/23/2023    [provider]  methylPREDNISolone (MEDROL DOSEPAK) 4 MG TBPK tablet Take 4 mg by mouth as directed. 12/17/22   [provider]  phenazopyridine (PYRIDIUM) 200 MG tablet  Take 1 tablet (200 mg total) by mouth 3 (three) times daily. 01/23/23   Raspet, Noberto Retort, PA-C    Family History Family History  Problem Relation Age of Onset   Hypertension Mother    Diabetes Mother    Heart disease Father    Diabetes Father    Diabetes Sister    Diabetes Sister    Cancer Maternal Aunt        uterine   Diabetes Brother    Anesthesia problems Neg Hx    Breast cancer Neg Hx     Social History Social History   Tobacco Use   Smoking status: Never    Passive exposure: Never   Smokeless tobacco: Never  Vaping Use   Vaping status: Never Used  Substance Use Topics   Alcohol use: Not Currently    Comment: Occassionally.   Drug use: Never     Allergies   Patient has no known allergies.   Review of Systems Review of Systems   Constitutional:  Negative for chills and fever.  HENT:  Positive for congestion and sore throat. Negative for ear pain.   Eyes:  Negative for discharge and redness.  Respiratory:  Positive for cough. Negative for shortness of breath and wheezing.   Gastrointestinal:  Negative for abdominal pain, diarrhea, nausea and vomiting.     Physical Exam Triage Vital Signs ED Triage Vitals  Encounter Vitals Group     BP      Systolic BP Percentile      Diastolic BP Percentile      Pulse      Resp      Temp      Temp src      SpO2      Weight      Height      Head Circumference      Peak Flow      Pain Score      Pain Loc      Pain Education      Exclude from Growth Chart    No data found.  Updated Vital Signs BP 128/84 (BP Location: Right Arm)   Pulse 79   Temp 98.4 F (36.9 C) (Oral)   Resp 18   Ht 5\' 4"  (1.626 m)   Wt 200 lb 9.9 oz (91 kg)   LMP 12/10/2019 (Exact Date)   SpO2 97%   BMI 34.44 kg/m   Visual Acuity Right Eye Distance:   Left Eye Distance:   Bilateral Distance:    Right Eye Near:   Left Eye Near:    Bilateral Near:     Physical Exam Vitals and nursing note reviewed.  Constitutional:      General: She is not in acute distress.    Appearance: Normal appearance. She is not ill-appearing.  HENT:     Head: Normocephalic and atraumatic.     Nose: Congestion present.     Mouth/Throat:     Mouth: Mucous membranes are moist.     Pharynx: No oropharyngeal exudate or posterior oropharyngeal erythema.  Eyes:     Conjunctiva/sclera: Conjunctivae normal.  Cardiovascular:     Rate and Rhythm: Normal rate and regular rhythm.     Heart sounds: Normal heart sounds. No murmur heard. Pulmonary:     Effort: Pulmonary effort is normal. No respiratory distress.     Breath sounds: Normal breath sounds. No wheezing, rhonchi or rales.  Skin:    General: Skin is warm and dry.  Neurological:  Mental Status: She is alert.  Psychiatric:        Mood and Affect:  Mood normal.        Thought Content: Thought content normal.      UC Treatments / Results  Labs (all labs ordered are listed, but only abnormal results are displayed) Labs Reviewed - No data to display  EKG   Radiology No results found.  Procedures Procedures (including critical care time)  Medications Ordered in UC Medications - No data to display  Initial Impression / Assessment and Plan / UC Course  I have reviewed the triage vital signs and the nursing notes.  Pertinent labs & imaging results that were available during my care of the patient were reviewed by me and considered in my medical decision making (see chart for details).    Initial read of x-ray with questionable patchy infiltrate to right lower lobe.  Will treat to cover possible pneumonia given duration of symptoms.  Advised follow-up if no gradual improvement or with any worsening symptoms.  Patient expressed understanding.  Final Clinical Impressions(s) / UC Diagnoses   Final diagnoses:  Persistent cough  Pneumonia of right lower lobe due to infectious organism   Discharge Instructions   None    ED Prescriptions     Medication Sig Dispense Auth. Provider   amoxicillin-clavulanate (AUGMENTIN) 875-125 MG tablet Take 1 tablet by mouth every 12 (twelve) hours. 14 tablet Erma Pinto F, PA-C   azithromycin (ZITHROMAX) 250 MG tablet Take 1 tablet (250 mg total) by mouth daily. Take first 2 tablets together, then 1 every day until finished. 6 tablet Tomi Bamberger, PA-C      PDMP not reviewed this encounter.   Tomi Bamberger, PA-C 04/02/23 [phone]

## 2023-04-02 NOTE — ED Triage Notes (Signed)
 Due to language barrier, an interpreter was present during the history-taking and subsequent discussion (and for part of the physical exam) with this patient. Arlys John. Number: [id number].  Here with Husband. "This started with Fever about 3 wks ago and she has continued to have Cough (productive)". No sob. No wheezing. Previous Diagnosis of Flu A.

## 2023-04-03 ENCOUNTER — Telehealth: Payer: Self-pay | Admitting: *Deleted

## 2023-04-03 NOTE — Telephone Encounter (Signed)
 Patient request call regarding labs  Sent to wrong office

## 2023-05-05 ENCOUNTER — Telehealth: Payer: Self-pay

## 2023-05-05 NOTE — Telephone Encounter (Signed)
 Health Coaching 3  interpreter- Natale Lay, [employer]   Goals- Patient has been sick over the past few months. She had the flu back in February and was diagnosed with pneumonia after her cough did not go away. Patient was unable to do any type of exercising during the months of February and March due to shortness of breath. Encouraged patient to not over do it with exercise and to listen to her body. If she feels shortness of breath or feels unwell to stop. Encouraged patient to try and get go rest and eat healthy to help her body recover.    Navigation:  Patient is aware of  a follow up session. Patient is scheduled for FU on 06/10/23.

## 2023-06-10 ENCOUNTER — Inpatient Hospital Stay: Payer: Self-pay | Attending: Obstetrics and Gynecology | Admitting: *Deleted

## 2023-06-10 VITALS — BP 143/90 | Ht 64.0 in | Wt 200.3 lb

## 2023-06-10 DIAGNOSIS — Z0000 Encounter for general adult medical examination without abnormal findings: Secondary | ICD-10-CM

## 2023-06-10 NOTE — Progress Notes (Signed)
 [facility] follow up   Interpreter: Mallissa Seals, Lenis Quin  Clinical Measurement:   Vitals:   06/10/23 0924 06/10/23 0939  BP: (!) 140/85 (!) 143/90      Medical History: Patient states that she does not know if she has high cholesterol, does not have high blood pressure and she does not have diabetes. Patient states that she does not know if she has history of gestational hypertension, does not know if she has history of pre-eclampsia/eclampsia and she does not know if she has history of gestational diabetes.     Medications: Patient states that she does not take medication to lower cholesterol, blood pressure and blood sugar.  Patient does not take an aspirin a day to help prevent a heart attack or stroke.    Blood pressure, self measurement: Patient states that she does not measure blood pressure from home. She checks her blood pressure N/A. She shares her readings with a health care provider: N/A.   Nutrition: Patient states that on average she eats 2 cups of fruit and 2 cups of vegetables per day. Patient states that she does eat fish at least 2 times per week. Patient eats more than half servings of whole grains. Patient drinks less than 36 ounces of beverages with added sugar weekly: yes. Patient is currently watching sodium or salt intake: yes. In the past 7 days patient has had 0 drinks containing alcohol. On average patient drinks 0 drinks containing alcohol per day.      Physical activity: Patient states that she gets 330 minutes of moderate and 330 minutes of vigorous physical activity each week.  Smoking status: Patient states that she has has never smoked .   Quality of life: Over the past 2 weeks patient states that she had little interest or pleasure in doing things: several days. She has been feeling down, depressed or hopeless:not at all.   Social Determinants of Health Assessment:   Computer Use: During the last 12 months patient states that she has used any of the  following: desktop/laptop, smart phone or tablet/other portable wireless computer: yes.   Internet Use: During the last 12 months, did you or any member of your household have access to the internet: Yes, by paying a cell phone company or internet service provider.   Food Insecurities: During the last 12 months, where there any times when you were worried that you would run out of food because of a lack of money or other resources: No.   Transportation Barriers: During the last 12 months, have you missed a doctor's appointment because of transportation problems: No.   Childcare Barriers: If you are currently using childcare services, please identify  the type of services you use. (If not using childcare services, please select "Not applicable"): not applicable. During the last 12 months, have you had any barriers to childcare services such as: not applicable.   Housing: What is your housing situation today: I have housing.   Intimate Partner Violence: During the last 12 months, how often did your partner physically hurt you: never. During the last 12 months, how often did your partner insult you or talk down to you: never.  Medication Adherence: During the last 12 months, did you ever forget to take your medicine: not applicable. During the last 12 months, were you careless ar times about taking your medicine: not applicable. During the last 12 months, when you felt better did you sometimes stop taking your medication: not applicable. During the last 12 months,  sometimes if you felt worse when you took your medicine did you stop taking it: not applicable.    Risk reduction and counseling:    Health Coaching: Patient has been making her own green juice daily. She does a combination of apple, spinach, celery and cucumber. Patient consumes a variety of whole grains including bread, oatmeal and whole grain cereals. Patient has been going to the gym daily for an hour and has been doing zumba at least 4  classes per week. Spoke with patient about carbohydrate intake. Patient consumes around 7 tortillas on days that she consumes them. She states that she does not consume them daily. Spoke with her about the daily recommendation for carbs. Patient's BP was elevated today. She states that she does have a machine at home. Encouraged her to start checking and tracking her blood pressure 1-2 times per week. Patient now has insurance. Encouraged her to establish care with a PCP for her preventive care needs.   Navigation: This was the  follow up session for this patient, I will check up on her progress in the coming months.

## 2023-06-25 ENCOUNTER — Ambulatory Visit

## 2023-06-29 ENCOUNTER — Ambulatory Visit: Admission: EM | Admit: 2023-06-29 | Discharge: 2023-06-29 | Disposition: A | Discharge status: Home / Self Care

## 2023-06-29 DIAGNOSIS — M545 Low back pain, unspecified: Secondary | ICD-10-CM | POA: Diagnosis not present

## 2023-06-29 DIAGNOSIS — G44209 Tension-type headache, unspecified, not intractable: Secondary | ICD-10-CM | POA: Insufficient documentation

## 2023-06-29 DIAGNOSIS — N3000 Acute cystitis without hematuria: Secondary | ICD-10-CM | POA: Insufficient documentation

## 2023-06-29 DIAGNOSIS — M5442 Lumbago with sciatica, left side: Secondary | ICD-10-CM | POA: Insufficient documentation

## 2023-06-29 DIAGNOSIS — R300 Dysuria: Secondary | ICD-10-CM | POA: Diagnosis present

## 2023-06-29 DIAGNOSIS — N8111 Cystocele, midline: Secondary | ICD-10-CM | POA: Insufficient documentation

## 2023-06-29 LAB — POCT URINALYSIS DIP (MANUAL ENTRY)
Bilirubin, UA: NEGATIVE
Glucose, UA: NEGATIVE mg/dL
Ketones, POC UA: NEGATIVE mg/dL
Nitrite, UA: NEGATIVE
Protein Ur, POC: NEGATIVE mg/dL
Spec Grav, UA: >=1.03 — AB (ref 1.010–1.025)
Urobilinogen, UA: 0.2 U/dL
pH, UA: 6 (ref 5.0–8.0)

## 2023-06-29 MED ORDER — NITROFURANTOIN MONOHYD MACRO 100 MG PO CAPS
100.0000 mg | ORAL_CAPSULE | Freq: Two times a day (BID) | ORAL | 0 refills | Status: DC
Start: 2023-06-29 — End: 2023-09-22

## 2023-06-29 NOTE — ED Provider Notes (Signed)
 [facility]    CSN: [id number] Arrival date & time: 06/29/23  1913      History   Chief Complaint Chief Complaint  Patient presents with   UTI Symptoms    HPI Crystal [name] is a 53 y.o. female.   Patient presents today for evaluation of dysuria that started about a week ago.  She denies any discharge or concerns for STDs.  She has not any fever.  She does report some lower abdominal pain as well as low back pain.  The history is provided by the patient. The history is limited by a language barrier. A language interpreter was used.    Past Medical History:  Diagnosis Date   Acute cystitis without hematuria 05/07/2020   CIN I (cervical intraepithelial neoplasia I)    Pap 02/2019: ASCUS w/ high risk HPV   Depression    Elevated LDL cholesterol level    Obesity (BMI 30-39.9)    Post-menopausal bleeding    Postmenopausal bleeding 03/17/2020   03/13/2020 - EMB with benign pathology and a polyp     Pre-diabetes     Patient Active Problem List   Diagnosis Date Noted   Acute left-sided low back pain with left-sided sciatica 06/29/2023   Midline cystocele 06/29/2023   Tension type headache 06/29/2023   Hypercholesterolemia 03/13/2023   Acute viral bronchitis 08/12/2022   Peripheral neuropathy 08/12/2021   Pre-diabetes 05/07/2020   Elevated LDL cholesterol level 05/07/2020   Obesity (BMI 30.0-34.9) 05/07/2020   MDD (recurrent major depressive disorder) in remission (HCC) 05/07/2020   Healthcare maintenance 05/07/2020   ASCUS with positive high risk HPV cervical 03/17/2020    Past Surgical History:  Procedure Laterality Date   CHOLECYSTECTOMY N/A 07/28/2012   Procedure: LAPAROSCOPIC CHOLECYSTECTOMY WITH INTRAOPERATIVE CHOLANGIOGRAM;  Surgeon: Kari Otto. Eli Grizzle, MD;  Location: [facility] OR;  Service: General;  Laterality: N/A;   TUBAL LIGATION     7 years ago    OB History     Gravida  6   Para  4   Term  4   Preterm      AB  1   Living  5       SAB      IAB      Ectopic      Multiple  1   Live Births  5            Home Medications    Prior to Admission medications   Medication Sig Start Date End Date Taking? Authorizing Provider  benzonatate  (TESSALON ) 100 MG capsule Take 100 mg by mouth. 04/07/23 04/06/24 Yes [provider]  nitrofurantoin , macrocrystal-monohydrate, (MACROBID ) 100 MG capsule Take 1 capsule (100 mg total) by mouth 2 (two) times daily. 06/29/23  Yes Vernestine Gondola, PA-C  acetaminophen  (TYLENOL ) 500 MG tablet Take 1,000 mg by mouth every 6 (six) hours as needed for fever. Last dose: 3am.   Yes [provider]  Azelastine HCl 137 MCG/SPRAY SOLN rociar ONE rociada IN EACH NOSTRIL TWICE DAILY AS DIRECTED Patient not taking: Reported on 01/23/2023    [provider]  guaifenesin (ROBITUSSIN) 100 MG/5ML syrup Take 200 mg by mouth 3 (three) times daily as needed for cough. Patient not taking: Reported on 06/10/2023    [provider]  methylPREDNISolone (MEDROL DOSEPAK) 4 MG TBPK tablet Take 4 mg by mouth as directed. Patient not taking: Reported on 06/10/2023 12/17/22   [provider]  phenazopyridine  (PYRIDIUM ) 200 MG tablet Take 1 tablet (200  mg total) by mouth 3 (three) times daily. Patient not taking: Reported on 06/10/2023 01/23/23   Raspet, Betsey Brow, PA-C    Family History Family History  Problem Relation Age of Onset   Hypertension Mother    Diabetes Mother    Heart disease Father    Diabetes Father    Diabetes Sister    Diabetes Sister    Cancer Maternal Aunt        uterine   Diabetes Brother    Anesthesia problems Neg Hx    Breast cancer Neg Hx     Social History Social History   Tobacco Use   Smoking status: Never    Passive exposure: Never   Smokeless tobacco: Never  Vaping Use   Vaping status: Never Used  Substance Use Topics   Alcohol use: Not Currently    Comment: Occassionally.   Drug use: Never     Allergies   Patient has no  known allergies.   Review of Systems Review of Systems  Constitutional:  Negative for chills and fever.  Eyes:  Negative for discharge and redness.  Respiratory:  Negative for shortness of breath.   Gastrointestinal:  Positive for abdominal pain. Negative for nausea and vomiting.  Genitourinary:  Positive for dysuria.  Musculoskeletal:  Positive for back pain.     Physical Exam Triage Vital Signs ED Triage Vitals  Encounter Vitals Group     BP 06/29/23 1928 116/77     Systolic BP Percentile --      Diastolic BP Percentile --      Pulse Rate 06/29/23 1928 75     Resp 06/29/23 1928 18     Temp 06/29/23 1928 98 F (36.7 C)     Temp Source 06/29/23 1928 Oral     SpO2 06/29/23 1928 98 %     Weight 06/29/23 1924 200 lb 6.4 oz (90.9 kg)     Height 06/29/23 1924 5\' 4"  (1.626 m)     Head Circumference --      Peak Flow --      Pain Score 06/29/23 1918 8     Pain Loc --      Pain Education --      Exclude from Growth Chart --    No data found.  Updated Vital Signs BP 116/77 (BP Location: Left Arm)   Pulse 75   Temp 98 F (36.7 C) (Oral)   Resp 18   Ht 5\' 4"  (1.626 m)   Wt 200 lb 6.4 oz (90.9 kg)   LMP 12/10/2019 (Exact Date)   SpO2 98%   BMI 34.40 kg/m   Visual Acuity Right Eye Distance:   Left Eye Distance:   Bilateral Distance:    Right Eye Near:   Left Eye Near:    Bilateral Near:     Physical Exam Vitals and nursing note reviewed.  Constitutional:      General: She is not in acute distress.    Appearance: Normal appearance. She is not ill-appearing.  HENT:     Head: Normocephalic and atraumatic.  Eyes:     Conjunctiva/sclera: Conjunctivae normal.  Cardiovascular:     Rate and Rhythm: Normal rate.  Pulmonary:     Effort: Pulmonary effort is normal. No respiratory distress.  Neurological:     Mental Status: She is alert.  Psychiatric:        Mood and Affect: Mood normal.        Behavior: Behavior normal.  Thought Content: Thought content  normal.      UC Treatments / Results  Labs (all labs ordered are listed, but only abnormal results are displayed) Labs Reviewed  POCT URINALYSIS DIP (MANUAL ENTRY) - Abnormal; Notable for the following components:      Result Value   Color, UA light yellow (*)    Clarity, UA cloudy (*)    Spec Grav, UA >=1.030 (*)    Blood, UA trace-intact (*)    Leukocytes, UA Small (1+) (*)    All other components within normal limits  URINE CULTURE    EKG   Radiology No results found.  Procedures Procedures (including critical care time)  Medications Ordered in UC Medications - No data to display  Initial Impression / Assessment and Plan / UC Course  I have reviewed the triage vital signs and the nursing notes.  Pertinent labs & imaging results that were available during my care of the patient were reviewed by me and considered in my medical decision making (see chart for details).    UA consistent with UTI.  Will treat with antibiotics and urine culture ordered.  Encouraged follow-up if no gradual improvement or with any further concerns.  Final Clinical Impressions(s) / UC Diagnoses   Final diagnoses:  Acute cystitis without hematuria   Discharge Instructions   None    ED Prescriptions     Medication Sig Dispense Auth. Provider   nitrofurantoin , macrocrystal-monohydrate, (MACROBID ) 100 MG capsule Take 1 capsule (100 mg total) by mouth 2 (two) times daily. 10 capsule Vernestine Gondola, PA-C      PDMP not reviewed this encounter.   Vernestine Gondola, PA-C 06/30/23 1651

## 2023-06-29 NOTE — ED Triage Notes (Signed)
 Due to language barrier, an interpreter was present during the history-taking and subsequent discussion (and for part of the physical exam) with this patient. Crystal [name]. Number: [id number].  Here with Spouse/Husband. "When I am done urinating I am having burning pain for about a week now". No discoloration in urine. No abnormal vaginal discharge. No concern for STI. No fever.

## 2023-06-30 ENCOUNTER — Encounter: Payer: Self-pay | Admitting: Physician Assistant

## 2023-07-01 LAB — URINE CULTURE

## 2023-07-06 ENCOUNTER — Ambulatory Visit (HOSPITAL_COMMUNITY): Payer: Self-pay

## 2023-09-22 ENCOUNTER — Ambulatory Visit
Admission: EM | Admit: 2023-09-22 | Discharge: 2023-09-22 | Disposition: A | Discharge status: Home / Self Care | Attending: Nurse Practitioner | Admitting: Nurse Practitioner

## 2023-09-22 DIAGNOSIS — R8290 Unspecified abnormal findings in urine: Secondary | ICD-10-CM | POA: Diagnosis present

## 2023-09-22 DIAGNOSIS — R300 Dysuria: Secondary | ICD-10-CM | POA: Insufficient documentation

## 2023-09-22 DIAGNOSIS — M5430 Sciatica, unspecified side: Secondary | ICD-10-CM | POA: Insufficient documentation

## 2023-09-22 DIAGNOSIS — E66811 Obesity, class 1: Secondary | ICD-10-CM | POA: Insufficient documentation

## 2023-09-22 LAB — POCT URINE DIPSTICK
Bilirubin, UA: NEGATIVE
Glucose, UA: NEGATIVE mg/dL
Ketones, POC UA: NEGATIVE mg/dL
Nitrite, UA: NEGATIVE
Spec Grav, UA: 1.01 (ref 1.010–1.025)
Urobilinogen, UA: 0.2 U/dL
pH, UA: 6.5 (ref 5.0–8.0)

## 2023-09-22 MED ORDER — CEPHALEXIN 500 MG PO CAPS: 500.0000 mg | ORAL_CAPSULE | Freq: Two times a day (BID) | ORAL | 0 refills | Status: AC

## 2023-09-22 NOTE — ED Triage Notes (Signed)
 Due to language barrier, an interpreter was present during the history-taking and subsequent discussion (and for part of the physical exam) with this patient. Crystal [name]. Number: [id number].  I am having Right lower back pain and it hurts when I pee. I am sure it is a UTI. No fever. No nausea/vomiting.

## 2023-09-22 NOTE — ED Provider Notes (Signed)
 [name]    CSN: [id number] Arrival date & time: 09/22/23  1754      History   Chief Complaint Chief Complaint  Patient presents with   UTI Symptoms    HPI Marita Burnsed is a 53 y.o. female.   Patient presents for evaluation of dysuria that started today.  No reported fever, abdominal pain, vomiting, diarrhea, abnormal vaginal bleeding or discharge.  She has not taken any medications for the symptoms.  Evaluated for UTI on 06/29/23 - urine culture suggested recollection due to multiple species identified but patient was feeling better and didn't come back in to repeat the test.  The history is provided by the patient. A language interpreter was used (Noemi, ID# [id number]).    Past Medical History:  Diagnosis Date   Acute cystitis without hematuria 05/07/2020   CIN I (cervical intraepithelial neoplasia I)    Pap 02/2019: ASCUS w/ high risk HPV   Depression    Elevated LDL cholesterol level    Obesity (BMI 30-39.9)    Post-menopausal bleeding    Postmenopausal bleeding 03/17/2020   03/13/2020 - EMB with benign pathology and a polyp     Pre-diabetes     Patient Active Problem List   Diagnosis Date Noted   Sciatica 09/22/2023   Class 1 obesity 09/22/2023   Acute left-sided low back pain with left-sided sciatica 06/29/2023   Midline cystocele 06/29/2023   Tension type headache 06/29/2023   Hypercholesterolemia 03/13/2023   Acute viral bronchitis 08/12/2022   Peripheral neuropathy 08/12/2021   Pre-diabetes 05/07/2020   Elevated LDL cholesterol level 05/07/2020   Obesity (BMI 30.0-34.9) 05/07/2020   MDD (recurrent major depressive disorder) in remission (HCC) 05/07/2020   Healthcare maintenance 05/07/2020   ASCUS with positive high risk HPV cervical 03/17/2020    Past Surgical History:  Procedure Laterality Date   CHOLECYSTECTOMY N/A 07/28/2012   Procedure: LAPAROSCOPIC CHOLECYSTECTOMY WITH INTRAOPERATIVE CHOLANGIOGRAM;  Surgeon: Donnice POUR. Belinda,  MD;  Location: [facility] OR;  Service: General;  Laterality: N/A;   TUBAL LIGATION     7 years ago    OB History     Gravida  6   Para  4   Term  4   Preterm      AB  1   Living         SAB  1   IAB      Ectopic      Multiple      Live Births               Home Medications    Prior to Admission medications   Medication Sig Start Date End Date Taking? Authorizing Provider  albuterol  (VENTOLIN  HFA) 108 (90 Base) MCG/ACT inhaler Inhale 2 puffs into the lungs every 4 (four) hours as needed for wheezing or shortness of breath. 07/08/23  Yes [provider]  amoxicillin -clavulanate (AUGMENTIN ) 875-125 MG tablet Take 1 tablet by mouth 2 (two) times daily. 04/02/23  Yes [provider]  acetaminophen  (TYLENOL ) 500 MG tablet Take 1,000 mg by mouth every 6 (six) hours as needed for fever. Last dose: 3am.    [provider]  Azelastine HCl 137 MCG/SPRAY SOLN rociar ONE rociada IN EACH NOSTRIL TWICE DAILY AS DIRECTED Patient not taking: Reported on 01/23/2023    [provider]  benzonatate  (TESSALON ) 100 MG capsule Take 100 mg by mouth. 04/07/23 04/06/24  [provider]  guaifenesin (ROBITUSSIN) 100 MG/5ML syrup Take 200 mg by mouth 3 (three)  times daily as needed for cough. Patient not taking: Reported on 06/10/2023    [provider]  methylPREDNISolone (MEDROL DOSEPAK) 4 MG TBPK tablet Take 4 mg by mouth as directed. Patient not taking: Reported on 06/10/2023 12/17/22   [provider]  nitrofurantoin , macrocrystal-monohydrate, (MACROBID ) 100 MG capsule Take 1 capsule (100 mg total) by mouth 2 (two) times daily. 06/29/23   Billy Asberry FALCON, PA-C  phenazopyridine  (PYRIDIUM ) 200 MG tablet Take 1 tablet (200 mg total) by mouth 3 (three) times daily. Patient not taking: Reported on 06/10/2023 01/23/23   Raspet, Rocky POUR, PA-C    Family History Family History  Problem Relation Age of Onset   Hypertension Mother    Diabetes  Mother    Heart disease Father    Diabetes Father    Diabetes Sister    Diabetes Sister    Cancer Maternal Aunt        uterine   Diabetes Brother    Anesthesia problems Neg Hx    Breast cancer Neg Hx     Social History Social History   Tobacco Use   Smoking status: Never    Passive exposure: Never   Smokeless tobacco: Never  Vaping Use   Vaping status: Never Used  Substance Use Topics   Alcohol use: Not Currently    Comment: Occassionally.   Drug use: Never     Allergies   Patient has no known allergies.   Review of Systems Review of Systems  Constitutional:  Negative for chills, fatigue and fever.  HENT:  Negative for congestion, rhinorrhea and sore throat.   Eyes:  Negative for pain and redness.  Respiratory:  Negative for cough and shortness of breath.   Cardiovascular:  Negative for chest pain.  Gastrointestinal:  Negative for abdominal pain, diarrhea and vomiting.  Genitourinary:  Positive for dysuria. Negative for pelvic pain, vaginal bleeding and vaginal discharge.  Musculoskeletal:  Negative for arthralgias and myalgias.  Skin:  Negative for rash.  Neurological:  Negative for dizziness and headaches.  Psychiatric/Behavioral:  Negative for behavioral problems.      Physical Exam Triage Vital Signs ED Triage Vitals  Encounter Vitals Group     BP 09/22/23 1844 129/86     Girls Systolic BP Percentile --      Girls Diastolic BP Percentile --      Boys Systolic BP Percentile --      Boys Diastolic BP Percentile --      Pulse Rate 09/22/23 1844 84     Resp 09/22/23 1844 18     Temp 09/22/23 1844 98.5 F (36.9 C)     Temp Source 09/22/23 1844 Oral     SpO2 09/22/23 1844 95 %     Weight 09/22/23 1842 200 lb 6.4 oz (90.9 kg)     Height 09/22/23 1842 5' 4 (1.626 m)     Head Circumference --      Peak Flow --      Pain Score 09/22/23 1838 8     Pain Loc --      Pain Education --      Exclude from Growth Chart --    No data found.  Updated Vital  Signs BP 129/86 (BP Location: Left Arm)   Pulse 84   Temp 98.5 F (36.9 C) (Oral)   Resp 18   Ht 5' 4 (1.626 m)   Wt 200 lb 6.4 oz (90.9 kg)   LMP 12/10/2019 (Exact Date)   SpO2 95%  BMI 34.40 kg/m   Visual Acuity Right Eye Distance:   Left Eye Distance:   Bilateral Distance:    Right Eye Near:   Left Eye Near:    Bilateral Near:     Physical Exam Vitals and nursing note reviewed.  Constitutional:      Appearance: Normal appearance.  HENT:     Head: Normocephalic.  Cardiovascular:     Rate and Rhythm: Normal rate and regular rhythm.     Heart sounds: Normal heart sounds.  Pulmonary:     Effort: Pulmonary effort is normal.     Breath sounds: Normal breath sounds.  Abdominal:     General: Bowel sounds are normal.     Tenderness: There is abdominal tenderness. There is no right CVA tenderness or left CVA tenderness.     Comments: Mild suprapubic discomfort.  Skin:    General: Skin is warm and dry.  Neurological:     General: No focal deficit present.     Mental Status: She is alert and oriented to person, place, and time.  Psychiatric:        Mood and Affect: Mood normal.        Behavior: Behavior normal.        Thought Content: Thought content normal.        Judgment: Judgment normal.    UC Treatments / Results  Labs (all labs ordered are listed, but only abnormal results are displayed) Labs Reviewed  POCT URINE DIPSTICK - Abnormal; Notable for the following components:      Result Value   Color, UA light yellow (*)    Clarity, UA cloudy (*)    Blood, UA large (*)    Leukocytes, UA Large (3+) (*)    All other components within normal limits    EKG   Radiology No results found.  Procedures Procedures (including critical care time)  Medications Ordered in UC Medications - No data to display  Initial Impression / Assessment and Plan / UC Course  I have reviewed the triage vital signs and the nursing notes.  Pertinent labs & imaging results  that were available during my care of the patient were reviewed by me and considered in my medical decision making (see chart for details).    Patient presents requesting evaluation for dysuria that started today.  POCT UA is indicative of an infection - urine culture sent and currently pending.  Will initiate antibiotic therapy while awaiting those results.  Discussed the use of Azo for urinary discomfort and ensure adequate oral hydration.  Red flags reviewed which would warrant return evaluation. Final Clinical Impressions(s) / UC Diagnoses   Final diagnoses:  None   Discharge Instructions   None    ED Prescriptions   None    PDMP not reviewed this encounter.   Janet Therisa PARAS, FNP 09/22/23 [phone]

## 2023-09-22 NOTE — Discharge Instructions (Signed)
 Antibiotic sent to the pharmacy on file. Urine culture is currently pending. May use Azo for discomfort. Ensure adequate oral hydration.

## 2023-09-23 LAB — URINE CULTURE: Culture: <10000 — AB

## 2023-09-24 ENCOUNTER — Ambulatory Visit (HOSPITAL_COMMUNITY): Payer: Self-pay
